# Patient Record
Sex: Female | Born: 1975 | Race: Black or African American | Hispanic: No | Marital: Married | Smoking: Never smoker
Health system: Southern US, Community
[De-identification: ages and names within clinical notes are randomized; demographics above are authoritative.]

## PROBLEM LIST (undated history)

## (undated) DIAGNOSIS — Z8619 Personal history of other infectious and parasitic diseases: Secondary | ICD-10-CM

## (undated) DIAGNOSIS — D649 Anemia, unspecified: Secondary | ICD-10-CM

## (undated) DIAGNOSIS — Z8739 Personal history of other diseases of the musculoskeletal system and connective tissue: Secondary | ICD-10-CM

## (undated) DIAGNOSIS — R87619 Unspecified abnormal cytological findings in specimens from cervix uteri: Secondary | ICD-10-CM

## (undated) DIAGNOSIS — E559 Vitamin D deficiency, unspecified: Secondary | ICD-10-CM

## (undated) DIAGNOSIS — Z87440 Personal history of urinary (tract) infections: Secondary | ICD-10-CM

## (undated) DIAGNOSIS — K219 Gastro-esophageal reflux disease without esophagitis: Secondary | ICD-10-CM

## (undated) DIAGNOSIS — A491 Streptococcal infection, unspecified site: Secondary | ICD-10-CM

## (undated) DIAGNOSIS — R300 Dysuria: Secondary | ICD-10-CM

## (undated) DIAGNOSIS — IMO0002 Reserved for concepts with insufficient information to code with codable children: Secondary | ICD-10-CM

## (undated) DIAGNOSIS — B373 Candidiasis of vulva and vagina: Secondary | ICD-10-CM

## (undated) DIAGNOSIS — A599 Trichomoniasis, unspecified: Secondary | ICD-10-CM

## (undated) DIAGNOSIS — N926 Irregular menstruation, unspecified: Secondary | ICD-10-CM

## (undated) DIAGNOSIS — O209 Hemorrhage in early pregnancy, unspecified: Secondary | ICD-10-CM

## (undated) DIAGNOSIS — Z8719 Personal history of other diseases of the digestive system: Secondary | ICD-10-CM

## (undated) DIAGNOSIS — R102 Pelvic and perineal pain: Secondary | ICD-10-CM

## (undated) DIAGNOSIS — B009 Herpesviral infection, unspecified: Secondary | ICD-10-CM

## (undated) DIAGNOSIS — R7303 Prediabetes: Secondary | ICD-10-CM

## (undated) DIAGNOSIS — K649 Unspecified hemorrhoids: Secondary | ICD-10-CM

## (undated) HISTORY — DX: Streptococcal infection, unspecified site: A49.1

## (undated) HISTORY — DX: Candidiasis of vulva and vagina: B37.3

## (undated) HISTORY — DX: Herpesviral infection, unspecified: B00.9

## (undated) HISTORY — DX: Hemorrhage in early pregnancy, unspecified: O20.9

## (undated) HISTORY — DX: Dysuria: R30.0

## (undated) HISTORY — DX: Personal history of other infectious and parasitic diseases: Z86.19

## (undated) HISTORY — DX: Personal history of other diseases of the musculoskeletal system and connective tissue: Z87.39

## (undated) HISTORY — DX: Unspecified abnormal cytological findings in specimens from cervix uteri: R87.619

## (undated) HISTORY — DX: Trichomoniasis, unspecified: A59.9

## (undated) HISTORY — DX: Prediabetes: R73.03

## (undated) HISTORY — DX: Pelvic and perineal pain: R10.2

## (undated) HISTORY — DX: Vitamin D deficiency, unspecified: E55.9

## (undated) HISTORY — DX: Personal history of urinary (tract) infections: Z87.440

## (undated) HISTORY — PX: WISDOM TOOTH EXTRACTION: SHX21

## (undated) HISTORY — DX: Reserved for concepts with insufficient information to code with codable children: IMO0002

## (undated) HISTORY — DX: Unspecified hemorrhoids: K64.9

## (undated) HISTORY — DX: Anemia, unspecified: D64.9

## (undated) HISTORY — DX: Personal history of other diseases of the digestive system: Z87.19

## (undated) HISTORY — DX: Irregular menstruation, unspecified: N92.6

---

## 1996-04-06 HISTORY — PX: WISDOM TOOTH EXTRACTION: SHX21

## 1997-07-05 DIAGNOSIS — Z87440 Personal history of urinary (tract) infections: Secondary | ICD-10-CM

## 1997-07-05 DIAGNOSIS — R300 Dysuria: Secondary | ICD-10-CM

## 1997-07-05 HISTORY — DX: Dysuria: R30.0

## 1997-07-05 HISTORY — DX: Personal history of urinary (tract) infections: Z87.440

## 2000-04-06 DIAGNOSIS — A491 Streptococcal infection, unspecified site: Secondary | ICD-10-CM

## 2000-04-06 HISTORY — DX: Streptococcal infection, unspecified site: A49.1

## 2000-04-07 DIAGNOSIS — O209 Hemorrhage in early pregnancy, unspecified: Secondary | ICD-10-CM

## 2000-04-07 HISTORY — DX: Hemorrhage in early pregnancy, unspecified: O20.9

## 2000-12-01 ENCOUNTER — Inpatient Hospital Stay (HOSPITAL_COMMUNITY): Admission: AD | Admit: 2000-12-01 | Discharge: 2000-12-01 | Payer: Self-pay | Admitting: Obstetrics and Gynecology

## 2000-12-03 ENCOUNTER — Inpatient Hospital Stay (HOSPITAL_COMMUNITY): Admission: AD | Admit: 2000-12-03 | Discharge: 2000-12-08 | Payer: Self-pay | Admitting: Obstetrics and Gynecology

## 2000-12-03 ENCOUNTER — Encounter (INDEPENDENT_AMBULATORY_CARE_PROVIDER_SITE_OTHER): Payer: Self-pay | Admitting: Specialist

## 2001-01-17 DIAGNOSIS — Z8719 Personal history of other diseases of the digestive system: Secondary | ICD-10-CM

## 2001-01-17 DIAGNOSIS — D649 Anemia, unspecified: Secondary | ICD-10-CM

## 2001-01-17 HISTORY — DX: Personal history of other diseases of the digestive system: Z87.19

## 2001-01-17 HISTORY — DX: Anemia, unspecified: D64.9

## 2001-04-06 DIAGNOSIS — Z8619 Personal history of other infectious and parasitic diseases: Secondary | ICD-10-CM

## 2001-04-06 HISTORY — DX: Personal history of other infectious and parasitic diseases: Z86.19

## 2001-06-03 ENCOUNTER — Other Ambulatory Visit: Admission: RE | Admit: 2001-06-03 | Discharge: 2001-06-03 | Payer: Self-pay | Admitting: Obstetrics and Gynecology

## 2001-06-03 DIAGNOSIS — N926 Irregular menstruation, unspecified: Secondary | ICD-10-CM

## 2001-06-03 HISTORY — DX: Irregular menstruation, unspecified: N92.6

## 2002-05-24 DIAGNOSIS — B373 Candidiasis of vulva and vagina: Secondary | ICD-10-CM

## 2002-05-24 DIAGNOSIS — B3731 Acute candidiasis of vulva and vagina: Secondary | ICD-10-CM

## 2002-05-24 DIAGNOSIS — Z8739 Personal history of other diseases of the musculoskeletal system and connective tissue: Secondary | ICD-10-CM

## 2002-05-24 HISTORY — DX: Candidiasis of vulva and vagina: B37.3

## 2002-05-24 HISTORY — DX: Personal history of other diseases of the musculoskeletal system and connective tissue: Z87.39

## 2002-05-24 HISTORY — DX: Acute candidiasis of vulva and vagina: B37.31

## 2002-06-23 ENCOUNTER — Other Ambulatory Visit: Admission: RE | Admit: 2002-06-23 | Discharge: 2002-06-23 | Payer: Self-pay | Admitting: Obstetrics and Gynecology

## 2003-03-14 DIAGNOSIS — R102 Pelvic and perineal pain: Secondary | ICD-10-CM

## 2003-03-14 HISTORY — DX: Pelvic and perineal pain: R10.2

## 2003-04-03 ENCOUNTER — Ambulatory Visit (HOSPITAL_COMMUNITY): Admission: RE | Admit: 2003-04-03 | Discharge: 2003-04-03 | Payer: Self-pay | Admitting: Obstetrics and Gynecology

## 2003-07-13 ENCOUNTER — Other Ambulatory Visit: Admission: RE | Admit: 2003-07-13 | Discharge: 2003-07-13 | Payer: Self-pay | Admitting: Obstetrics and Gynecology

## 2004-09-12 ENCOUNTER — Other Ambulatory Visit: Admission: RE | Admit: 2004-09-12 | Discharge: 2004-09-12 | Payer: Self-pay | Admitting: Obstetrics and Gynecology

## 2004-09-12 ENCOUNTER — Ambulatory Visit (HOSPITAL_COMMUNITY): Admission: RE | Admit: 2004-09-12 | Discharge: 2004-09-12 | Payer: Self-pay | Admitting: Obstetrics and Gynecology

## 2005-01-11 ENCOUNTER — Inpatient Hospital Stay (HOSPITAL_COMMUNITY): Admission: AD | Admit: 2005-01-11 | Discharge: 2005-01-11 | Payer: Self-pay | Admitting: Obstetrics and Gynecology

## 2005-01-14 ENCOUNTER — Inpatient Hospital Stay (HOSPITAL_COMMUNITY): Admission: RE | Admit: 2005-01-14 | Discharge: 2005-01-17 | Payer: Self-pay | Admitting: Obstetrics and Gynecology

## 2009-08-02 ENCOUNTER — Ambulatory Visit (HOSPITAL_COMMUNITY): Admission: RE | Admit: 2009-08-02 | Discharge: 2009-08-02 | Payer: Self-pay | Admitting: Obstetrics and Gynecology

## 2009-11-01 ENCOUNTER — Inpatient Hospital Stay (HOSPITAL_COMMUNITY): Admission: AD | Admit: 2009-11-01 | Discharge: 2009-11-04 | Payer: Self-pay | Admitting: Obstetrics and Gynecology

## 2010-06-21 LAB — CBC
HCT: 22.1 % — ABNORMAL LOW (ref 36.0–46.0)
HCT: 24 % — ABNORMAL LOW (ref 36.0–46.0)
HCT: 30.7 % — ABNORMAL LOW (ref 36.0–46.0)
HCT: 38.3 % (ref 36.0–46.0)
Hemoglobin: 10.4 g/dL — ABNORMAL LOW (ref 12.0–15.0)
Hemoglobin: 12.8 g/dL (ref 12.0–15.0)
Hemoglobin: 7.5 g/dL — ABNORMAL LOW (ref 12.0–15.0)
Hemoglobin: 8 g/dL — ABNORMAL LOW (ref 12.0–15.0)
MCH: 29.2 pg (ref 26.0–34.0)
MCH: 29.6 pg (ref 26.0–34.0)
MCH: 30 pg (ref 26.0–34.0)
MCH: 30.6 pg (ref 26.0–34.0)
MCHC: 33.3 g/dL (ref 30.0–36.0)
MCHC: 33.4 g/dL (ref 30.0–36.0)
MCHC: 34 g/dL (ref 30.0–36.0)
MCHC: 34.2 g/dL (ref 30.0–36.0)
MCV: 87.6 fL (ref 78.0–100.0)
MCV: 88.3 fL (ref 78.0–100.0)
MCV: 88.6 fL (ref 78.0–100.0)
MCV: 89.6 fL (ref 78.0–100.0)
Platelets: 122 10*3/uL — ABNORMAL LOW (ref 150–400)
Platelets: 133 10*3/uL — ABNORMAL LOW (ref 150–400)
Platelets: 155 10*3/uL (ref 150–400)
Platelets: 157 10*3/uL (ref 150–400)
RBC: 2.46 MIL/uL — ABNORMAL LOW (ref 3.87–5.11)
RBC: 2.71 MIL/uL — ABNORMAL LOW (ref 3.87–5.11)
RBC: 3.48 MIL/uL — ABNORMAL LOW (ref 3.87–5.11)
RBC: 4.37 MIL/uL (ref 3.87–5.11)
RDW: 15.8 % — ABNORMAL HIGH (ref 11.5–15.5)
RDW: 15.9 % — ABNORMAL HIGH (ref 11.5–15.5)
RDW: 16 % — ABNORMAL HIGH (ref 11.5–15.5)
RDW: 16.1 % — ABNORMAL HIGH (ref 11.5–15.5)
WBC: 10.5 10*3/uL (ref 4.0–10.5)
WBC: 15.1 10*3/uL — ABNORMAL HIGH (ref 4.0–10.5)
WBC: 15.6 10*3/uL — ABNORMAL HIGH (ref 4.0–10.5)
WBC: 17 10*3/uL — ABNORMAL HIGH (ref 4.0–10.5)

## 2010-06-21 LAB — SURGICAL PCR SCREEN
MRSA, PCR: NEGATIVE
Staphylococcus aureus: NEGATIVE

## 2010-06-21 LAB — URINALYSIS, ROUTINE W REFLEX MICROSCOPIC
Glucose, UA: NEGATIVE mg/dL
Hgb urine dipstick: NEGATIVE
Ketones, ur: 15 mg/dL — AB
Nitrite: NEGATIVE
Protein, ur: NEGATIVE mg/dL
Specific Gravity, Urine: >1.03 — ABNORMAL HIGH (ref 1.005–1.030)
Urobilinogen, UA: 1 mg/dL (ref 0.0–1.0)
pH: 6 (ref 5.0–8.0)

## 2010-06-21 LAB — RPR: RPR Ser Ql: NONREACTIVE

## 2010-08-22 NOTE — H&P (Signed)
[facility]  Patient:    Lindsey Harrington, Lindsey Harrington Visit Number: [id number] MRN: [id number]          Service Type: OBS Location: [facility] Attending Physician:  Leonard Schwartz Dictated by:   Saverio Danker, C.N.M. Admit Date:  12/01/2000 Discharge Date: 12/01/2000                           History and Physical  HISTORY:                      Ms. Bruss is a 35 year old married black female gravida 1, para 0 at 58 2/7 weeks who presents for induction of labor secondary to being post dates.  The risks of induction including failure to progress, fetal distress, and increased risk of cesarean section have all been discussed with patient and she and her husband desire to proceed.  She denies any nausea, vomiting, headaches, or visual disturbances.  She was seen in maternity admissions on Wednesday secondary to having 1+ proteinuria in the office visit and feeling decreased fetal movement and had a reactive NST and negative protein on a catheterized urine specimen at that time.  She also had negative PIH laboratories at that visit.  She was subsequently scheduled for induction of labor because she was post dates.  Her pregnancy has been followed at [facility] by the certified nurse midwife service and has been essentially uncomplicated, though at risk for positive group B Strep, first trimester spotting, irregular cycles, obesity.  PAST OBSTETRICAL HISTORY:     She is a primigravida with an LMP of February 06, 2000 giving her an [facility] of November 12, 2000.  She also had a history of irregular cycles so the more certain EDC was November 24, 2000 established by early ultrasound.  ALLERGIES:                    TYLOX.  She says it makes her shaky and dizzy. She has difficulty swallowing pills.  PAST MEDICAL HISTORY:         She reports having had the usual childhood diseases.  She reports a history of anemia in the past, a history of occasional urinary  tract infections.  FAMILY HISTORY:               Significant for maternal grandmother with heart attack and hypertension, paternal aunt with arthritis.  GENETIC HISTORY:              Significant only for a cousin with some kind of heart problems, a maternal first cousin with Down syndrome, and the father of the babys first cousin with some kind of mental retardation.  SOCIAL HISTORY:               She is married to Eual Fines who is involved and supportive.  They are both employed full-time.  They are of the [employer] faith.  They deny any illicit drug use, alcohol, or smoking with this pregnancy.  PRENATAL LABORATORIES:        Blood type O+.  Antibody screen is negative. Sickle cell trait is negative.  Syphilis is nonreactive.  Rubella is immune. Hepatitis B surface antigen is negative.  HIV is nonreactive.  GC and chlamydia are both negative.  Her one hour glucola was elevated.  Her three hour GTT was within normal range.  She declined a maternal serum alpha fetoprotein.  Her 36 week  beta Strep was positive.  PHYSICAL EXAMINATION  VITAL SIGNS:                  Stable.  She is afebrile.  HEENT:                        Grossly within normal limits.  HEART:                        Regular rate and rhythm.  CHEST:                        Clear.  BREASTS:                      Soft and nontender.  ABDOMEN:                      Gravid with uterine contractions that are irregular and mild.  Fetal heart rate is reactive and reassuring.  PELVIC:                       Cervix is 1 cm, external os closed, internal os 50%, vertex, -2.  EXTREMITIES:                  Within normal limits.  ASSESSMENT:                   1. Intrauterine pregnancy at 41 2/7 weeks.                               2. Unfavorable cervix.                               3. Positive group B Strep.  PLAN:                         Admit to labor and delivery for a consult with Dr. Leonard Schwartz for induction  of labor.  Start the induction process with Cervidil for cervical ripening.  To otherwise follow routine C.N.M. orders and give her penicillin for group B Strep prophylaxis when in labor. Dictated by:   Vance Gather Duplantis, C.N.M. Attending Physician:  Leonard Schwartz DD:  12/03/00 TD:  12/03/00 Job: [id number]

## 2010-08-22 NOTE — Discharge Summary (Signed)
[facility]  Patient:    Lindsey Harrington, Lindsey Harrington Visit Number: [id number] MRN: [id number]          Service Type: OBS Location: 910A 9132 01 Attending Physician:  Leonard Schwartz Dictated by:   Wynelle Bourgeois, CNM Admit Date:  12/03/2000 Discharge Date: 12/08/2000                             Discharge Summary  ADMISSION DIAGNOSES:          1. Intrauterine pregnancy at 41-2/7 weeks.                               2. Unfavorable cervix.                               3. Group B strep positive.  DISCHARGE DIAGNOSES:          1. Intrauterine pregnancy at 41-2/7 weeks.                               2. Unfavorable cervix.                               3. Group B strep positive.                               4. Status post primary low transverse                                  cesarean section.                               5. Arrested active phase of labor.  HOSPITAL PROCEDURES:          1. Primary low transverse cesarean section for a                                  viable female infant named Jalyn, 8 pounds                                  0 ounces, Apgars 6,7, and 9.                               2. Epidural anesthesia.  HOSPITAL COURSE:              The patient was admitted for induction of labor secondary to postdates. Cervical ripening was begun on December 04, 2000 with good progress and Pitocin was started later that afternoon with slow progression of cervical dilation. The patient progressed to 9 cm on December 05, 2000 during the middle of the night and progressed slowed at that point, and the patient began to develop nonreassuring fetal heart rate tracing with tachycardia and prolonged decelerations and her cervix remained unchanged at 9 cm, -1 station, with a swelling cervix. A decision was  made at that time to proceed with primary low transverse cesarean section with Dr. Stefano Gaul and Mack Guise, C.N.M. as first assistant for arrested  active phase of labor and nonreassuring fetal heart tones.  The patient recovered normally and on postoperative day #1 was doing well with normal physical examination and clean, dry incision. The baby was taken to the NICU for tachypnea and antibiotic treatment for seven days. On postoperative day #2 the patient was doing well, although appropriately concerned about her baby. On postoperative day #3 her vital signs are stable. She is afebrile. Chest was clear to auscultation bilaterally. Heart rate with regular rate and rhythm and abdomen was soft and appropriately tender. Incision was clean, dry, and intact with staples. Lochia was small to moderate. Extremities were within normal limits and the patient was seen by Dr. Stefano Gaul and deemed to have received the full benefit of her hospital stay and was discharged home after staple removal.  DISCHARGE LABORATORIES:       WBCs 16.3, hemoglobin 9.6, hematocrit 28.2, platelets 151,000.  DISCHARGE MEDICATIONS:        Motrin, Darvocet, and Micronor.  DISCHARGE INSTRUCTIONS:       Instructions per [facility] handout.  DISCHARGE FOLLOWUP:           The patient is to follow up in six weeks at [facility] or p.r.n. as indicated. Dictated by:   Wynelle Bourgeois, CNM Attending Physician:  Leonard Schwartz DD:  12/08/00 TD:  12/08/00 Job: [id number]

## 2010-08-22 NOTE — Op Note (Signed)
[facility]  Patient:    Lindsey Harrington, Lindsey Harrington Visit Number: [id number] MRN: [id number]          Service Type: OBS Location: 910A 9132 01 Attending Physician:  Leonard Schwartz Dictated by:   Janine Limbo, M.D. Proc. Date: 12/05/00 Admit Date:  12/03/2000                             Operative Report  PREOPERATIVE DIAGNOSES:       1. A 41-1/2-week gestation.                               2. Arrested active phase of labor.  POSTOPERATIVE DIAGNOSES:      1. A 41-1/2-week gestation.                               2. Arrested active phase of labor.  PROCEDURE:                    Primary low transverse cesarean section.  SURGEON:                      Janine Limbo, M.D.  ASSISTANT:                    Mack Guise, C.N.M.  ANESTHESIA:                   Epidural.  DISPOSITION:                  [name] is a 35 year old female, gravida 1, para 0, who presented at 41-2/[redacted] weeks gestation on December 03, 2000 for induction of labor. Her induction did not produce labor on December 04, 2000 and therefore, she was continued on augmentation. She did begin to labor finally and she dilated her cervix to 9 cm. At that point, however, she began to develop swelling of her cervix. The fetal head was still noted to be at -1 to 0 station. The patient then began having decelerations associated with continued Pitocin augmentation. The decision was made to proceed with cesarean delivery. The patient understood the indications for her surgical procedure and she accepted the risk of, but not limited to, anesthetic complications, bleeding, infections, and possible damage to the surrounding organs.  FINDINGS:                     A 3627 gram female Haynes Bast) was delivered from an occiput anterior presentation. The Apgars were 6 at one minute, 7 at five minutes, and 9 at ten minutes. The infant did cry but there was some question about compromise of the infants  breathing. Vernix was noted in the lung spaces after intubation. The patient had normal fallopian tubes, ovaries, and a normal appearing uterus otherwise.  DESCRIPTION OF PROCEDURE:     The patient was taken to the operating room where it was determined that the epidural she had received for labor would be adequate for cesarean section. The abdomen was prepped with multiple layers of Betadine. A sterile drape was then applied. A low transverse incision was made in the abdomen and carried sharply through the subcutaneous tissue, the fascia, and the anterior peritoneum. An incision was made in the lower uterine segment and extended transversely. The  fetal head was delivered without difficulty. The mouth and nose were suctioned. The infant was then completely delivered and handed to the awaiting pediatric team. Routine cord blood studies were obtained. The placenta was removed. The uterine cavity was cleaned of amniotic fluid, clotted blood, and membranes. The incision was closed using a running locking suture of 2-0 Vicryl. Hemostasis was adequate. The pericolonic gutters were cleaned of amniotic fluid and clotted blood. The anterior peritoneum was closed using a figure-of-eight suture of 2-0 Vicryl as was the abdominal musculature. All layers were vigorously irrigated. The fascia was then closed using a running suture of 0 Vicryl followed by three interrupted sutures of 0 Vicryl. The subcutaneous layer was closed using a running suture of 2-0 Vicryl. The skin was reapproximated using skin staples. Sponge, needle, and instrument counts were correct on two occasions. The estimated blood loss was 700 cc. The patient tolerated her procedure well. The patient was then taken to the recovery room in stable condition. The infant was taken to the neonatal intensive care unit for evaluation of her respiratory status. Dictated by:   Janine Limbo, M.D. Attending Physician:  Leonard Schwartz DD:  12/05/00 TD:  12/05/00 Job: [phone] [id number]

## 2010-08-22 NOTE — Op Note (Signed)
Lindsey Harrington, Lindsey Harrington               ACCOUNT NO.:  [id number]   MEDICAL RECORD NO.:  [id number]          PATIENT TYPE:  INP   LOCATION:  9199                          FACILITY:  [facility]   PHYSICIAN:  Janine Limbo, M.D.DATE OF BIRTH:  07/25/1975   DATE OF PROCEDURE:  01/14/2005  DATE OF DISCHARGE:                                 OPERATIVE REPORT   PREOPERATIVE DIAGNOSIS:  1.  Term intrauterine pregnancy.  2.  Prior cesarean section.  3.  Breech presentation.   POSTOPERATIVE DIAGNOSIS:  1.  Term intrauterine pregnancy.  2.  Prior cesarean section.  3.  Breech presentation.   PROCEDURE:  Repeat low transverse cesarean section.   SURGEON:  Dr. Lafayette Dragon stringer   FIRST ASSISTANT:  Rica Koyanagi, C.N.M.   ANESTHETIC:  Spinal.   DISPOSITION:  Lindsey Harrington is a 35 year old female, gravida 2, para 1-0-0-1,  who presents for a repeat cesarean section. The patient understands the  indications for her surgical procedure and she accepts the risks of, but not  limited to, anesthetic complications, bleeding, infection, and possible  damage to surrounding organs.   FINDINGS:  A 7 pounds 3 ounces female infant ([name]) was delivered from a  complete breech presentation. The Apgars were 8 at one minute and 9 at 5  minutes. The uterus, fallopian tubes, and the ovaries were normal for the  gravid state.   PROCEDURE:  The patient was taken to the operating room where spinal  anesthetic was given. The patient's abdomen, perineum, and outer vagina were  prepped with multiple layers of Betadine. A Foley catheter was placed in the  bladder. The patient was then sterilely draped. The lower abdomen was  injected with 10 mL of half percent Marcaine with epinephrine. A low  transverse incision was made and carried sharply through the subcutaneous  tissue, fascia, and the anterior peritoneum. An incision was made in the  lower uterine segment and extended transversely. The infant was  delivered  from a breech extraction without difficulty. The mouth and nose were  suctioned. The cord was clamped and cut and the infant was handed to the  awaiting pediatric team. Routine cord blood studies were obtained. The  placenta was removed. The uterine cavity was cleaned of amniotic fluid and  clotted blood. The uterine incision was closed using a running locking  suture of 2-0 Vicryl followed by an imbricating suture of 2-0 Vicryl. The  pelvis was irrigated. The anterior peritoneum and abdominal musculature were  reapproximated in the midline using 2-0 Vicryl. The fascia was closed using  a running suture of 0 Vicryl followed by three interrupted sutures of 0  Vicryl. The subcutaneous layer was irrigated. The subcutaneous layer was  closed using 0 Vicryl. The skin was reapproximated using a subcuticular  suture of 3-0 Monocryl. Sponge, needle,  instrument counts were correct on two occasions. Estimated blood loss for  the procedure was 1000 mL. The patient tolerated her procedure well. She was  returned to the recovery room in stable condition. The infant was taken to  the full-term nursery in stable condition.  Janine Limbo, M.D.  Electronically Signed     AVS/MEDQ  D:  01/14/2005  T:  01/14/2005  Job:  [id number]

## 2010-08-22 NOTE — Discharge Summary (Signed)
Lindsey Harrington, Lindsey Harrington               ACCOUNT NO.:  [id number]   MEDICAL RECORD NO.:  [id number]          PATIENT TYPE:  INP   LOCATION:  9127                          FACILITY:  [facility]   PHYSICIAN:  Janine Limbo, M.D.DATE OF BIRTH:  02-Jun-1975   DATE OF ADMISSION:  01/14/2005  DATE OF DISCHARGE:  01/17/2005                                 DISCHARGE SUMMARY   ADMISSION DIAGNOSES:  1.  Intrauterine pregnancy at term.  2.  Previous Cesarean section.  3.  Desires repeat Cesarean section.   DISCHARGE DIAGNOSES:  1.  Intrauterine pregnancy at term.  2.  Previous Cesarean section.  3.  Desires repeat Cesarean section.  4.  Status post Cesarean section of a female infant named Lindsey Harrington weighing 7      pounds 3 ounces, Apgars 8 and 9.   HOSPITAL PROCEDURES:  1.  Spinal anesthesia.  2.  Repeat low transverse Cesarean section.   HOSPITAL COURSE:  The patient was admitted for an elective repeat Cesarean  section which was performed by Dr. Stefano Gaul under spinal anesthesia with no  complications. She was delivered of a female infant named Lindsey Harrington, weighing 7  pounds 3 ounces, Apgars 8 and 9. Estimated blood loss was 1,000 cc. She was  taken to recovery and then to mother/baby unit where she did well. She had  some difficulty with pain management over the first two days. Darvocet was  not working, and Dilaudid was tried. She claimed that Dilaudid was not  working, but she did sleep with the Dilaudid. On postoperative day #3, pain  was better controlled.   PHYSICAL EXAMINATION:  VITAL SIGNS:  Vital signs were stable. She was  afebrile.  CHEST:  Clear.  HEART:  Regular rate and rhythm.  ABDOMEN:  Soft and appropriately tender. Incision was clean, dry, and  intact. Lochia was normal.  EXTREMITIES:  Within normal limits.   Since she was deemed to receive the full benefit of her hospital stay, she  was discharged home.   DISCHARGE MEDICATIONS:  1.  Motrin 600 mg p.o. q.6h. p.r.n.  2.   Micronor 1 p.o. daily.  3.  Dilaudid 2 mg 1 p.o. q.4h. p.r.n. severe pain.  4.  Darvocet N-100 one to two p.o. q.4h. p.r.n. when not using Dilaudid.   DISCHARGE LABORATORY DATA:  White blood cell count 16.3, hemoglobin 9.3,  platelet count 165.   DISCHARGE INSTRUCTIONS:  Per handout. Discharge followup in 6 weeks or  p.r.n.      Marie L. Williams, C.N.M.      Janine Limbo, M.D.  Electronically Signed    MLW/MEDQ  D:  01/17/2005  T:  01/17/2005  Job:  [id number]

## 2010-08-22 NOTE — H&P (Signed)
NAMEJAQUELINNE, GLENDENING               ACCOUNT NO.:  [id number]   MEDICAL RECORD NO.:  [id number]           PATIENT TYPE:   LOCATION:                                 FACILITY:   PHYSICIAN:  Janine Limbo, M.D.DATE OF BIRTH:  [birth year]   DATE OF ADMISSION:  DATE OF DISCHARGE:                                HISTORY & PHYSICAL   Ms. Schlegel is a 35 year old gravida 2, para 1-0-0-1 who presents at term for  repeat cesarean delivery. Her pregnancy has been followed by the M.D.  service at [facility] and is remarkable for:  1.  Previous C-section, desires repeat.  2.  HSV-II.  3.  Obesity.  4.  Group B strep positive.   This patient entered prenatal care on June 26, 2004 at approximately [redacted]  weeks gestation as determined by dates and confirmed with this early first  trimester ultrasound. Her pregnancy has been unremarkable. She has been size  equal to dates throughout, normotensive with no proteinuria.   Prenatal lab work on June 26, 2004:  Hemoglobin and hematocrit 12.9 and  39.6, platelets 284,000. Blood type and Rh O+, antibody screen negative,  sickle cell trait negative, VDRL nonreactive, rubella immune, hepatitis B  surface antigen negative, HIV nonreactive. The patient declined quad screen.  An 18-week ultrasound showed normal anatomy, growth and fluid. At 28 weeks,  1-hour glucose challenge was elevated; 3-hour GTT within normal limits. At  36 weeks, culture of the vaginal tract is positive for group B strep.   OBSTETRICAL HISTORY:  In 2002, the patient had a primary low transverse  cesarean section with the birth of a 7-pound 15-ounce female, infant named  [place], and the present pregnancy.   MEDICAL HISTORY:  Is unremarkable.   SURGICAL HISTORY:  C section in 2002, wisdom teeth in 1998.   FAMILY HISTORY:  Maternal grandmother and the patient's brother with a  history of chronic hypertension. Maternal grandfather cancer. Paternal uncle  cancer of unknown type. Maternal  grandmother stroke. Genetic history is  unremarkable   SOCIAL HISTORY:  Ms. Orea is a married African-American female. Her  husband, Tomasita Beevers, is involved and supportive. They are [employer] in their  faith. The patient has an allergy to Tylox and denies the use of tobacco,  alcohol or illicit drugs.   REVIEW OF SYSTEMS:  As described above. The patient is typical of one with a  uterine pregnancy at term. She present presents for repeat cesarean  delivery.   PHYSICAL EXAMINATION:  VITAL SIGNS:  Vital signs are stable. The patient is  afebrile.  HEENT:  Unremarkable.  HEART:  Regular rate and rhythm.  LUNGS:  Clear.  ABDOMEN:  Gravid in its contour. Uterine fundus noted to extend 40 cm above  the level of the pubic symphysis. Leopold's maneuvers find the infant to be  in a longitudinal lie, cephalic presentation and the estimated fetal weight  of 7-1/2 to 8 pounds. Fetal heart tones are 150s.  EXTREMITIES:  Extremities show no pathologic edema. DTRs are 1+ with no  clonus. No calf tenderness is noted bilaterally.  ASSESSMENT:  Intrauterine pregnancy at term, repeat cesarean delivery.   PLAN:  Admit per Dr. Marline Backbone with routine preop orders as written.      Rica Koyanagi, C.N.M.      Janine Limbo, M.D.  Electronically Signed    SDM/MEDQ  D:  01/14/2005  T:  01/14/2005  Job:  [id number]

## 2011-07-22 ENCOUNTER — Telehealth: Payer: Self-pay | Admitting: Obstetrics and Gynecology

## 2011-07-22 NOTE — Telephone Encounter (Signed)
rx fefill not approved due to pt got rx called into pharmacy on 07/2011 with 1 refill. Pt has aex 08/05/11 with avs . alyacen 1-35-28 tab sig 1 po qd . bt cma

## 2011-08-05 ENCOUNTER — Ambulatory Visit: Payer: Self-pay | Admitting: Obstetrics and Gynecology

## 2011-08-26 ENCOUNTER — Encounter: Payer: Self-pay | Admitting: Obstetrics and Gynecology

## 2011-08-26 ENCOUNTER — Ambulatory Visit (INDEPENDENT_AMBULATORY_CARE_PROVIDER_SITE_OTHER): Payer: 59 | Admitting: Obstetrics and Gynecology

## 2011-08-26 VITALS — BP 114/70 | HR 74 | Ht 66.0 in | Wt 197.0 lb

## 2011-08-26 DIAGNOSIS — D219 Benign neoplasm of connective and other soft tissue, unspecified: Secondary | ICD-10-CM | POA: Insufficient documentation

## 2011-08-26 DIAGNOSIS — Z124 Encounter for screening for malignant neoplasm of cervix: Secondary | ICD-10-CM

## 2011-08-26 DIAGNOSIS — R102 Pelvic and perineal pain: Secondary | ICD-10-CM

## 2011-08-26 DIAGNOSIS — N920 Excessive and frequent menstruation with regular cycle: Secondary | ICD-10-CM

## 2011-08-26 DIAGNOSIS — D259 Leiomyoma of uterus, unspecified: Secondary | ICD-10-CM

## 2011-08-26 DIAGNOSIS — D649 Anemia, unspecified: Secondary | ICD-10-CM

## 2011-08-26 DIAGNOSIS — B009 Herpesviral infection, unspecified: Secondary | ICD-10-CM

## 2011-08-26 DIAGNOSIS — Z01419 Encounter for gynecological examination (general) (routine) without abnormal findings: Secondary | ICD-10-CM

## 2011-08-26 DIAGNOSIS — K649 Unspecified hemorrhoids: Secondary | ICD-10-CM

## 2011-08-26 DIAGNOSIS — N949 Unspecified condition associated with female genital organs and menstrual cycle: Secondary | ICD-10-CM

## 2011-08-26 MED ORDER — RABEPRAZOLE SODIUM 20 MG PO TBEC: 20.0000 mg | DELAYED_RELEASE_TABLET | Freq: Every day | ORAL | Status: AC

## 2011-08-26 MED ORDER — NORGESTIM-ETH ESTRAD TRIPHASIC 0.18/0.215/0.25 MG-35 MCG PO TABS: 1.0000 | ORAL_TABLET | Freq: Every day | ORAL | Status: DC

## 2011-08-26 MED ORDER — VALACYCLOVIR HCL 500 MG PO TABS: 500.0000 mg | ORAL_TABLET | Freq: Two times a day (BID) | ORAL | Status: DC

## 2011-08-26 NOTE — Progress Notes (Signed)
Subjective:    Lindsey Harrington is a 36 y.o. female, G3P3003, who presents for an annual exam. The patient wants tubal sterilization. Reviewed her use of BCPs for cycle control and discussed ablation, Mirena IUD and myomectomy as possible means of addressing the heavy flow she reports off of BCPs.  Wants to resume Tri Sprintec as her current BCPs cause acne.  Menstrual cycle:   LMP: Patient's last menstrual period was 08/17/2011.           Cycle is monthly with normal flow                                  and without intermenstrual bleeding or severe dysmenorrhea  Review of Systems Pertinent items are noted in HPI. Denies pelvic pain, uti symptoms, vaginitis symptoms, irregular bleeding, menopausal symptoms, change in bowel habits or rectal bleeding   Objective:    BP 114/70  Pulse 74  Ht 5\' 6"  (1.676 m)  Wt 197 lb (89.359 kg)  BMI 31.80 kg/m2  LMP 08/17/2011  :  Wt Readings from Last 1 Encounters:  08/26/11 197 lb (89.359 kg)    Body mass index is 31.80 kg/(m^2). General Appearance: Alert, appropriate appearance for age. No acute distress HEENT: Grossly normal Neck / Thyroid: Supple, no thyromegaly or cervical adenopathy Lungs: clear to auscultation bilaterally Back: No CVA tenderness Breast Exam: No masses or nodes.No dimpling, nipple retraction or discharge. Cardiovascular: Regular rate and rhythm.  Gastrointestinal: Soft, non-tender, no masses or organomegaly Pelvic Exam: EGBUS-wnl, vagina-normal rugae, cervix- without lesions or tenderness, uterus appears normal size shape and consistency though exam limited by habitus,  adnexae-no masses or tenderness Lymphatic Exam: Non-palpable nodes in neck, clavicular,  axillary, or inguinal regions  Skin: no rashes or abnormalities Extremities: no clubbing cyanosis or edema  Neurologic: grossly normal Psychiatric: Alert and oriented, appropriate affect.   Assessment:   Routine GYN Exam Desire for  Sterilzation Fibroids Menorrhagia-controlled with BCPs Plan:   Refill: Tri-Sprintec #1 1po qd 11 rf; Aciphex 20 mg #30 1 po             daily 6 rf;  Valtrex 500 mg #30 1 po bid x 5 days prn             11 rf    U/S to evaluate fibroids (never had an ultrasound without being pregnant) in order to consider all options for menorrhagia management   Reviewed Mirena, ablation, tubal sterilization procedures along with risks, benefits and limitations-brochures to be given at check out PAP sent   RTO after ultrasound-to see Dr. Malva Limes

## 2011-08-26 NOTE — Progress Notes (Signed)
Regular Periods: yes Mammogram: no  Monthly Breast Ex.: yes Exercise: yes  Tetanus < 10 years: yes Seatbelts: yes  NI. Bladder Functn.: yes Abuse at home: no  Daily BM's: yes Stressful Work: no  Healthy Diet: no Sigmoid-Colonoscopy: NO  Calcium: no Medical problems this year: ? ABOUT BTL   LAST PAP:2012 NL  Contraception: ALYACEN ( GEN FOR ON 1/35)  Mammogram:  NO  PCP: NO  PMH: NO CHANGE  FMH: NO CHANGE  Last Bone Scan: NO

## 2011-09-01 LAB — PAP IG W/ RFLX HPV ASCU

## 2011-09-30 ENCOUNTER — Ambulatory Visit (INDEPENDENT_AMBULATORY_CARE_PROVIDER_SITE_OTHER): Payer: 59 | Admitting: Obstetrics and Gynecology

## 2011-09-30 ENCOUNTER — Other Ambulatory Visit: Payer: Self-pay | Admitting: Obstetrics and Gynecology

## 2011-09-30 ENCOUNTER — Encounter: Payer: Self-pay | Admitting: Obstetrics and Gynecology

## 2011-09-30 ENCOUNTER — Ambulatory Visit (INDEPENDENT_AMBULATORY_CARE_PROVIDER_SITE_OTHER): Payer: 59

## 2011-09-30 VITALS — BP 110/70 | Resp 16 | Wt 201.0 lb

## 2011-09-30 DIAGNOSIS — N920 Excessive and frequent menstruation with regular cycle: Secondary | ICD-10-CM

## 2011-09-30 DIAGNOSIS — D259 Leiomyoma of uterus, unspecified: Secondary | ICD-10-CM

## 2011-09-30 DIAGNOSIS — D219 Benign neoplasm of connective and other soft tissue, unspecified: Secondary | ICD-10-CM

## 2011-09-30 DIAGNOSIS — Z309 Encounter for contraceptive management, unspecified: Secondary | ICD-10-CM

## 2011-09-30 NOTE — Progress Notes (Signed)
HISTORY OF PRESENT ILLNESS  Lindsey Harrington is a 36 y.o. year old female,[id number], who presents for a problem visit. The patient is scheduled for an ultrasound to evaluate fibroids.  She is interested in contraception.  Subjective:  The patient does not want Depo-Provera.  Her husband has declined vasectomy.  Objective:  BP 110/70  Resp 16  Wt 201 lb (91.173 kg)  LMP 09/19/2011   General: alert, cooperative and no distress  Exam deferred.  Ultrasound: Uterus measures 9.56 x 5.72 cm.  Endometrium measured 0.711 cm.  A 2.6 cm intramural fibroid was noted on the left with calcifications.  Assessment:  Fibroid uterus Contraceptive management  Plan:  The medical and surgical management of fibroids were reviewed.  The patient is largely asymptomatic.  I do not feel that any therapy is required at the moment.  Contraceptive options were outlined.  The risk and benefits of each were discussed.  Questions were answered.  The patient seems most interested in [place] or ParaGard.  Handouts were given.  The patient will call when she wants to schedule.  Return to office prn if symptoms worsen or fail to improve.   Leonard Schwartz M.D.  09/30/2011 12:49 PM

## 2011-11-06 ENCOUNTER — Encounter: Payer: 59 | Admitting: Obstetrics and Gynecology

## 2011-11-12 ENCOUNTER — Encounter: Payer: 59 | Admitting: Obstetrics and Gynecology

## 2011-11-12 ENCOUNTER — Ambulatory Visit (INDEPENDENT_AMBULATORY_CARE_PROVIDER_SITE_OTHER): Payer: 59 | Admitting: Obstetrics and Gynecology

## 2011-11-12 ENCOUNTER — Encounter: Payer: Self-pay | Admitting: Obstetrics and Gynecology

## 2011-11-12 VITALS — BP 114/70 | HR 74 | Wt 210.0 lb

## 2011-11-12 DIAGNOSIS — Z30430 Encounter for insertion of intrauterine contraceptive device: Secondary | ICD-10-CM

## 2011-11-12 DIAGNOSIS — IMO0001 Reserved for inherently not codable concepts without codable children: Secondary | ICD-10-CM

## 2011-11-12 LAB — POCT URINE PREGNANCY: Preg Test, Ur: NEGATIVE

## 2011-11-12 MED ORDER — LEVONORGESTREL 20 MCG/24HR IU IUD
INTRAUTERINE_SYSTEM | Freq: Once | INTRAUTERINE | Status: AC
  Administered 2011-11-12: 1 via INTRAUTERINE

## 2011-11-12 NOTE — Addendum Note (Signed)
Addended byWinfred Leeds on: 11/12/2011 12:34 PM   Modules accepted: Orders

## 2011-11-12 NOTE — Patient Instructions (Addendum)
Keep follow up appointment in 4 weeks  Call [facility] [phone]:  -for temperature of 100.4 degrees Fahrenheit or more -pain not improved with over the counter pain medications (Ibuprofen, Advil, Aleve,        Tylenol or acetaminophen) -for excessive bleeding (more than a usual period) -for any other concerns  Do not place anything in your vagina for the next 7 days   

## 2011-11-12 NOTE — Progress Notes (Signed)
IUD INSERTION NOTE  Lindsey Harrington is a 36 y.o. female [phone] who presents for IUD insertion.  Currently on BCPs.  Consent signed after risks and benefits were reviewed including but not limited to bleeding, infection, expulsion and risk of uterine perforation that may require an additional procedure for removal.  LMP: Patient's last menstrual period was 10/15/2011. UPT: negative  MIRENA LOT NUMBER: [id number]  Uterus assessed for size and position Prepped with BetadinE Tenaculum placed on anterior lip of cervix after Hurricane gel was applied Uterus sounded at  8 cm Insertion of MIRENA IUD per protocol without any complications Strings trimmed   Assessment:  IUD Insertion  Plan:  1. Patient instructed to call with oral temperature of 100.4 degrees Fahrenheit or more, excessive bleeding or pain that is not relieved with OTC analgesia taken as directed  2. Patient instructed on how  to check IUD strings and encouraged to do so after each menstrual cycle  3. Advised not to place anything in vagina or have sexual intercourse for 7 days  4. Follow-up: 4 weeks   Doyne Micke PA-C 11/12/2011 11:56 AM

## 2011-12-09 ENCOUNTER — Encounter: Payer: Self-pay | Admitting: Obstetrics and Gynecology

## 2011-12-09 ENCOUNTER — Ambulatory Visit (INDEPENDENT_AMBULATORY_CARE_PROVIDER_SITE_OTHER): Payer: 59 | Admitting: Obstetrics and Gynecology

## 2011-12-09 VITALS — BP 116/72 | HR 80 | Wt 209.0 lb

## 2011-12-09 DIAGNOSIS — Z30431 Encounter for routine checking of intrauterine contraceptive device: Secondary | ICD-10-CM

## 2011-12-09 LAB — POCT URINE PREGNANCY: Preg Test, Ur: NEGATIVE

## 2011-12-09 NOTE — Progress Notes (Signed)
36 YO with Mirena inserted 11/12/11 denies any pain or continued bleeding after the first week,  O: Abdomen: soft, non-tender      Pelvic:  EGBUS-wnl, vagina-normal, cervix-strings visible, uterus-non-tender, adnexae-no tenderness  A:  IUD Check  P:  RTO- May 2014 AEx or prn  Mackensi Mahadeo, PA-C

## 2011-12-10 ENCOUNTER — Encounter: Payer: 59 | Admitting: Obstetrics and Gynecology

## 2014-02-05 ENCOUNTER — Encounter: Payer: Self-pay | Admitting: Obstetrics and Gynecology

## 2014-12-14 ENCOUNTER — Other Ambulatory Visit: Payer: Self-pay | Admitting: Obstetrics and Gynecology

## 2014-12-14 DIAGNOSIS — N6310 Unspecified lump in the right breast, unspecified quadrant: Secondary | ICD-10-CM

## 2014-12-14 DIAGNOSIS — R928 Other abnormal and inconclusive findings on diagnostic imaging of breast: Secondary | ICD-10-CM

## 2014-12-20 ENCOUNTER — Other Ambulatory Visit: Payer: Self-pay

## 2014-12-26 ENCOUNTER — Other Ambulatory Visit: Payer: Self-pay

## 2014-12-26 ENCOUNTER — Ambulatory Visit
Admission: RE | Admit: 2014-12-26 | Discharge: 2014-12-26 | Disposition: A | Discharge status: Home / Self Care | Payer: 59 | Source: Ambulatory Visit | Attending: Obstetrics and Gynecology | Admitting: Obstetrics and Gynecology

## 2014-12-26 DIAGNOSIS — N6310 Unspecified lump in the right breast, unspecified quadrant: Secondary | ICD-10-CM

## 2014-12-26 DIAGNOSIS — R928 Other abnormal and inconclusive findings on diagnostic imaging of breast: Secondary | ICD-10-CM

## 2016-03-25 ENCOUNTER — Other Ambulatory Visit: Payer: Self-pay | Admitting: General Surgery

## 2016-03-26 ENCOUNTER — Other Ambulatory Visit: Payer: Self-pay | Admitting: General Surgery

## 2016-03-26 DIAGNOSIS — K469 Unspecified abdominal hernia without obstruction or gangrene: Secondary | ICD-10-CM

## 2016-04-01 ENCOUNTER — Ambulatory Visit
Admission: RE | Admit: 2016-04-01 | Discharge: 2016-04-01 | Disposition: A | Discharge status: Home / Self Care | Payer: 59 | Source: Ambulatory Visit | Attending: General Surgery | Admitting: General Surgery

## 2016-04-01 DIAGNOSIS — K469 Unspecified abdominal hernia without obstruction or gangrene: Secondary | ICD-10-CM

## 2016-04-01 MED ORDER — IOPAMIDOL (ISOVUE-300) INJECTION 61%: 100.0000 mL | Freq: Once | INTRAVENOUS | Status: DC | PRN

## 2017-03-09 ENCOUNTER — Encounter (HOSPITAL_COMMUNITY): Payer: Self-pay

## 2017-03-09 ENCOUNTER — Other Ambulatory Visit: Payer: Self-pay

## 2017-03-12 ENCOUNTER — Other Ambulatory Visit: Payer: Self-pay | Admitting: Obstetrics and Gynecology

## 2017-03-18 NOTE — H&P (Signed)
Admission History and Physical Exam for a Gynecology Patient  [name] is a 41 y.o. female, [phone], who presents for a conization of the cervix. Her Pap of Oct 2018 showed ASCUS with HR HPV. She had the same in 2017. Her colposcopy and biopsy was benign in 2017. 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  OB History    Gravida Para Term Preterm AB Living   6 6 6     3    SAB TAB Ectopic Multiple Live Births           3      Past Medical History:  Diagnosis Date  . Abnormal Pap smear   . Anemia 01/17/01  . Anemia   . Dysuria 07/05/97   postcoital  . First trimester bleeding 04/07/00  . GBS (group B streptococcus) infection 2002  . GERD (gastroesophageal reflux disease)   . H/O candidiasis   . H/O cystitis   . H/O hemorrhoids 01/17/01  . H/O herpes simplex infection   2003   . H/O low back pain 05/24/02  . H/O varicella   . Hemorrhoid   . Herpes   . Hx: UTI (urinary tract infection) 07/05/97  . Irregular menses 06/03/01  . Monilial vaginitis 05/24/02  . Pelvic pain 03/14/03  . Pelvic pain   . Trichomonas     No medications prior to admission.    Past Surgical History:  Procedure Laterality Date  . CESAREAN SECTION  2002 & 2006  . CESAREAN SECTION    . [place] EXTRACTION  1998  . WISDOM TOOTH EXTRACTION      Allergies  Allergen Reactions  . Tylox [Oxycodone-Acetaminophen] Palpitations    Family History: family history includes Arthritis in her maternal grandmother; Cancer in her maternal grandfather, paternal aunt, and paternal uncle; Gout in her maternal grandmother; Heart disease in her father; Hyperlipidemia in her father; Hypertension in her brother and maternal grandmother; Muscular dystrophy in her sister; Stroke in her maternal grandmother.  Social History:  reports that  has never smoked. she has never used smokeless  tobacco. She reports that she drinks alcohol. She reports that she does not use drugs.  Review of systems: See HPI.  Admission Physical Exam:    BMI equals 35.6  There were no vitals taken for this visit.  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  She understands  the indications for her surgical procedure as well as the alternative treatment options.  She accepts the risk of, but not limited to, anesthetic complications, bleeding, infections, and possible damage to the surrounding organs.   Eli Hose 03/18/2017

## 2017-03-19 ENCOUNTER — Encounter (HOSPITAL_COMMUNITY)
Admission: RE | Disposition: A | Discharge status: Home / Self Care | Payer: Self-pay | Source: Ambulatory Visit | Attending: Obstetrics and Gynecology

## 2017-03-19 ENCOUNTER — Ambulatory Visit (HOSPITAL_COMMUNITY): Payer: PRIVATE HEALTH INSURANCE | Admitting: Anesthesiology

## 2017-03-19 ENCOUNTER — Ambulatory Visit (HOSPITAL_COMMUNITY)
Admission: RE | Admit: 2017-03-19 | Discharge: 2017-03-19 | Disposition: A | Discharge status: Home / Self Care | Payer: PRIVATE HEALTH INSURANCE | Source: Ambulatory Visit | Attending: Obstetrics and Gynecology | Admitting: Obstetrics and Gynecology

## 2017-03-19 ENCOUNTER — Other Ambulatory Visit: Payer: Self-pay

## 2017-03-19 ENCOUNTER — Encounter (HOSPITAL_COMMUNITY): Payer: Self-pay | Admitting: *Deleted

## 2017-03-19 DIAGNOSIS — K219 Gastro-esophageal reflux disease without esophagitis: Secondary | ICD-10-CM | POA: Insufficient documentation

## 2017-03-19 DIAGNOSIS — Z885 Allergy status to narcotic agent status: Secondary | ICD-10-CM | POA: Diagnosis not present

## 2017-03-19 DIAGNOSIS — Z87440 Personal history of urinary (tract) infections: Secondary | ICD-10-CM | POA: Diagnosis not present

## 2017-03-19 DIAGNOSIS — D060 Carcinoma in situ of endocervix: Secondary | ICD-10-CM | POA: Insufficient documentation

## 2017-03-19 DIAGNOSIS — B009 Herpesviral infection, unspecified: Secondary | ICD-10-CM | POA: Insufficient documentation

## 2017-03-19 DIAGNOSIS — Z9889 Other specified postprocedural states: Secondary | ICD-10-CM | POA: Diagnosis not present

## 2017-03-19 DIAGNOSIS — Z975 Presence of (intrauterine) contraceptive device: Secondary | ICD-10-CM | POA: Insufficient documentation

## 2017-03-19 DIAGNOSIS — Z79899 Other long term (current) drug therapy: Secondary | ICD-10-CM | POA: Diagnosis not present

## 2017-03-19 DIAGNOSIS — N871 Moderate cervical dysplasia: Secondary | ICD-10-CM | POA: Diagnosis present

## 2017-03-19 HISTORY — PX: CERVICAL CONIZATION W/BX: SHX1330

## 2017-03-19 HISTORY — DX: Gastro-esophageal reflux disease without esophagitis: K21.9

## 2017-03-19 LAB — CBC
HCT: 42.8 % (ref 36.0–46.0)
Hemoglobin: 13.8 g/dL (ref 12.0–15.0)
MCH: 29.4 pg (ref 26.0–34.0)
MCHC: 32.2 g/dL (ref 30.0–36.0)
MCV: 91.1 fL (ref 78.0–100.0)
PLATELETS: 254 10*3/uL (ref 150–400)
RBC: 4.7 MIL/uL (ref 3.87–5.11)
RDW: 13.6 % (ref 11.5–15.5)
WBC: 10.3 10*3/uL (ref 4.0–10.5)

## 2017-03-19 LAB — PREGNANCY, URINE: PREG TEST UR: NEGATIVE

## 2017-03-19 SURGERY — CONE BIOPSY, CERVIX
Anesthesia: General

## 2017-03-19 MED ORDER — PROPOFOL 10 MG/ML IV BOLUS
INTRAVENOUS | Status: DC | PRN
  Administered 2017-03-19: 250 mg via INTRAVENOUS

## 2017-03-19 MED ORDER — SCOPOLAMINE 1 MG/3DAYS TD PT72
1.0000 | MEDICATED_PATCH | Freq: Once | TRANSDERMAL | Status: DC
  Administered 2017-03-19: 1.5 mg via TRANSDERMAL

## 2017-03-19 MED ORDER — ACETIC ACID 5 % SOLN
Status: AC
  Filled 2017-03-19: qty 500

## 2017-03-19 MED ORDER — DEXAMETHASONE SODIUM PHOSPHATE 4 MG/ML IJ SOLN
INTRAMUSCULAR | Status: DC | PRN
  Administered 2017-03-19: 8 mg via INTRAVENOUS

## 2017-03-19 MED ORDER — LIDOCAINE HCL (CARDIAC) 20 MG/ML IV SOLN
INTRAVENOUS | Status: DC | PRN
  Administered 2017-03-19: 100 mg via INTRAVENOUS

## 2017-03-19 MED ORDER — KETOROLAC TROMETHAMINE 30 MG/ML IJ SOLN
INTRAMUSCULAR | Status: DC | PRN
  Administered 2017-03-19: 30 mg via INTRAVENOUS

## 2017-03-19 MED ORDER — SODIUM CHLORIDE 0.9 % IJ SOLN
INTRAMUSCULAR | Status: AC
  Filled 2017-03-19: qty 100

## 2017-03-19 MED ORDER — LACTATED RINGERS IV SOLN
INTRAVENOUS | Status: DC
  Administered 2017-03-19 (×2): via INTRAVENOUS

## 2017-03-19 MED ORDER — MIDAZOLAM HCL 2 MG/2ML IJ SOLN
INTRAMUSCULAR | Status: DC | PRN
  Administered 2017-03-19 (×2): 1 mg via INTRAVENOUS

## 2017-03-19 MED ORDER — VASOPRESSIN 20 UNIT/ML IV SOLN
INTRAVENOUS | Status: AC
  Filled 2017-03-19: qty 1

## 2017-03-19 MED ORDER — FENTANYL CITRATE (PF) 100 MCG/2ML IJ SOLN
INTRAMUSCULAR | Status: DC | PRN
  Administered 2017-03-19 (×2): 50 ug via INTRAVENOUS

## 2017-03-19 MED ORDER — LIDOCAINE HCL (CARDIAC) 20 MG/ML IV SOLN
INTRAVENOUS | Status: AC
  Filled 2017-03-19: qty 5

## 2017-03-19 MED ORDER — FERRIC SUBSULFATE 259 MG/GM EX SOLN
CUTANEOUS | Status: AC
  Filled 2017-03-19: qty 8

## 2017-03-19 MED ORDER — MIDAZOLAM HCL 2 MG/2ML IJ SOLN
INTRAMUSCULAR | Status: AC
  Filled 2017-03-19: qty 2

## 2017-03-19 MED ORDER — IODINE STRONG (LUGOLS) 5 % PO SOLN
ORAL | Status: DC | PRN
  Administered 2017-03-19: 0.2 mL

## 2017-03-19 MED ORDER — BUPIVACAINE-EPINEPHRINE (PF) 0.5% -1:200000 IJ SOLN
INTRAMUSCULAR | Status: AC
  Filled 2017-03-19: qty 30

## 2017-03-19 MED ORDER — HYDROMORPHONE HCL 1 MG/ML IJ SOLN
INTRAMUSCULAR | Status: AC
  Administered 2017-03-19: 0.25 mg via INTRAVENOUS
  Filled 2017-03-19: qty 0.5

## 2017-03-19 MED ORDER — DEXAMETHASONE SODIUM PHOSPHATE 10 MG/ML IJ SOLN
INTRAMUSCULAR | Status: AC
  Filled 2017-03-19: qty 1

## 2017-03-19 MED ORDER — IODINE STRONG (LUGOLS) 5 % PO SOLN
ORAL | Status: AC
  Filled 2017-03-19: qty 1

## 2017-03-19 MED ORDER — HYDROMORPHONE HCL 1 MG/ML IJ SOLN
0.2500 mg | INTRAMUSCULAR | Status: DC | PRN
  Administered 2017-03-19 (×2): 0.25 mg via INTRAVENOUS

## 2017-03-19 MED ORDER — ONDANSETRON HCL 4 MG/2ML IJ SOLN
INTRAMUSCULAR | Status: AC
  Filled 2017-03-19: qty 2

## 2017-03-19 MED ORDER — IBUPROFEN 800 MG PO TABS: 800.0000 mg | ORAL_TABLET | Freq: Three times a day (TID) | ORAL | 1 refills | Status: DC | PRN

## 2017-03-19 MED ORDER — PROPOFOL 10 MG/ML IV BOLUS
INTRAVENOUS | Status: AC
  Filled 2017-03-19: qty 20

## 2017-03-19 MED ORDER — BUPIVACAINE-EPINEPHRINE 0.5% -1:200000 IJ SOLN
INTRAMUSCULAR | Status: DC | PRN
  Administered 2017-03-19: 20 mL

## 2017-03-19 MED ORDER — ONDANSETRON HCL 4 MG/2ML IJ SOLN
INTRAMUSCULAR | Status: DC | PRN
  Administered 2017-03-19: 4 mg via INTRAVENOUS

## 2017-03-19 MED ORDER — KETOROLAC TROMETHAMINE 30 MG/ML IJ SOLN
INTRAMUSCULAR | Status: AC
  Filled 2017-03-19: qty 1

## 2017-03-19 MED ORDER — SCOPOLAMINE 1 MG/3DAYS TD PT72
MEDICATED_PATCH | TRANSDERMAL | Status: AC
  Filled 2017-03-19: qty 1

## 2017-03-19 MED ORDER — FENTANYL CITRATE (PF) 250 MCG/5ML IJ SOLN
INTRAMUSCULAR | Status: AC
  Filled 2017-03-19: qty 5

## 2017-03-19 MED ORDER — MEPERIDINE HCL 25 MG/ML IJ SOLN: 6.2500 mg | INTRAMUSCULAR | Status: DC | PRN

## 2017-03-19 MED ORDER — PROMETHAZINE HCL 25 MG/ML IJ SOLN: 6.2500 mg | INTRAMUSCULAR | Status: DC | PRN

## 2017-03-19 SURGICAL SUPPLY — 26 items
APPLICATOR COTTON TIP 6IN STRL (MISCELLANEOUS) ×4 IMPLANT
BLADE SURG 11 STRL SS (BLADE) ×3 IMPLANT
CATH ROBINSON RED A/P 16FR (CATHETERS) ×3 IMPLANT
CLEANER TIP ELECTROSURG 2X2 (MISCELLANEOUS) ×3 IMPLANT
CONTAINER PREFILL 10% NBF 60ML (FORM) ×3 IMPLANT
ELECT REM PT RETURN 9FT ADLT (ELECTROSURGICAL) ×3
ELECTRODE REM PT RTRN 9FT ADLT (ELECTROSURGICAL) IMPLANT
GLOVE BIOGEL PI IND STRL 7.0 (GLOVE) ×1 IMPLANT
GLOVE BIOGEL PI IND STRL 8.5 (GLOVE) ×1 IMPLANT
GLOVE BIOGEL PI INDICATOR 7.0 (GLOVE) ×2
GLOVE BIOGEL PI INDICATOR 8.5 (GLOVE) ×2
GLOVE ECLIPSE 8.0 STRL XLNG CF (GLOVE) ×3 IMPLANT
GOWN STRL REUS W/TWL LRG LVL3 (GOWN DISPOSABLE) ×6 IMPLANT
NS IRRIG 1000ML POUR BTL (IV SOLUTION) ×3 IMPLANT
PACK VAGINAL MINOR WOMEN LF (CUSTOM PROCEDURE TRAY) ×3 IMPLANT
PAD OB MATERNITY 4.3X12.25 (PERSONAL CARE ITEMS) ×3 IMPLANT
PENCIL SMOKE EVAC W/HOLSTER (ELECTROSURGICAL) ×2 IMPLANT
SCOPETTES 8  STERILE (MISCELLANEOUS) ×4
SCOPETTES 8 STERILE (MISCELLANEOUS) ×2 IMPLANT
SPONGE SURGIFOAM ABS GEL 12-7 (HEMOSTASIS) ×2 IMPLANT
SUT VICRYL 0 UR6 27IN ABS (SUTURE) ×8 IMPLANT
SYR TB 1ML 25GX5/8 (SYRINGE) IMPLANT
TOWEL OR 17X24 6PK STRL BLUE (TOWEL DISPOSABLE) ×6 IMPLANT
TUBING NON-CON 1/4 X 20 CONN (TUBING) ×1 IMPLANT
TUBING NON-CON 1/4 X 20' CONN (TUBING) ×1
YANKAUER SUCT BULB TIP NO VENT (SUCTIONS) ×2 IMPLANT

## 2017-03-19 NOTE — Anesthesia Preprocedure Evaluation (Signed)
Anesthesia Evaluation  Patient identified by MRN, date of birth, ID band Patient awake    Reviewed: Allergy & Precautions, NPO status , Patient's Chart, lab work & pertinent test results  Airway Mallampati: II  TM Distance: >3 FB Neck ROM: Full    Dental no notable dental hx.    Pulmonary neg pulmonary ROS,    Pulmonary exam normal breath sounds clear to auscultation       Cardiovascular negative cardio ROS Normal cardiovascular exam Rhythm:Regular Rate:Normal     Neuro/Psych negative neurological ROS  negative psych ROS   GI/Hepatic negative GI ROS, Neg liver ROS, GERD  ,  Endo/Other  negative endocrine ROS  Renal/GU negative Renal ROS  negative genitourinary   Musculoskeletal negative musculoskeletal ROS (+)   Abdominal (+) + obese,   Peds negative pediatric ROS (+)  Hematology negative hematology ROS (+)   Anesthesia Other Findings   Reproductive/Obstetrics negative OB ROS                             Anesthesia Physical Anesthesia Plan  ASA: II  Anesthesia Plan: General   Post-op Pain Management:    Induction: Intravenous  PONV Risk Score and Plan: 3 and Ondansetron, Dexamethasone and Midazolam  Airway Management Planned: LMA  Additional Equipment:   Intra-op Plan:   Post-operative Plan: Extubation in OR  Informed Consent: I have reviewed the patients History and Physical, chart, labs and discussed the procedure including the risks, benefits and alternatives for the proposed anesthesia with the patient or authorized representative who has indicated his/her understanding and acceptance.   Dental advisory given  Plan Discussed with: CRNA  Anesthesia Plan Comments:         Anesthesia Quick Evaluation

## 2017-03-19 NOTE — Op Note (Signed)
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  Assistant:  None  Anesthetic:  Choice  Disposition:  The patient presents with a history of cervical intraepithelial neoplasia of the endocervix diagnosed by colposcopy and biopsies. She understands the indications for her cold by conization of the cervix as well as the alternative treatment options. She accepts the risk of, but not limited to, anesthetic complications, bleeding, infections, and possible damage to the surrounding organs.  Findings:  The uterus was upper limits normal size.  No adnexal masses were appreciated.  There is no parametrial thickening.  The IUD string was initially not seen at the cervix.  The cervix was stained with Lugol's solution.  There were no nonstaining areas noted.  Care was taken not to remove the IUD or to cut the IUD strings.  At the end of our procedure, the IUD string could be seen at the cervix.  Procedure:  The patient was taken to the operating room where a general anesthetic was given. A timeout was performed identifying the patient and the procedure. The patient was placed in a lithotomy position. The perineum and vagina were prepped with multiple layers of Betadine. The bladder was drained of urine. The patient was sterilely draped. An examination under anesthesia was performed. A paracervical block was placed using 10 cc of half percent Marcaine with epinephrine. Lugol solution was placed on the cervix. The cervix was then injected with 10 cc of half percent Marcaine with epinephrine 1-200,000. Angle sutures were placed at the 3:00 and  9:00 positions. The endocervix was sounded. A circumferential incision was made around the cervical os incorporating all nonstaining areas from the Lugol solution. The incision was continued in a conelike fashion to the endocervix. A stitch was placed at the 12:00 position of the conization specimen. The specimen was sent to pathology. Inverting sutures were placed at the 12:00, 3:00, 6:00, and 9:00 positions. Hemostasis was adequate. Examination was repeated. The cervix was firm. All instruments were removed from the vagina. The patient was returned to the supine position. She was awakened from her anesthetic without difficulty. She was transported to the recovery room in stable condition. Sponge and needle counts were correct. The estimated blood loss for the procedure was 20 cc. The conization specimen was sent to pathology with a stitch at the 12 o'clock position.  Followup instructions:  The patient will return to see Dr. Raphael Gibney in 2 weeks for follow-up examination. She will call for questions or concerns. She will call for complications.  Discharge medications:  Motrin 800 mg every 8 hours as needed for mild to moderate pain  Gildardo Cranker, M.D.  03/19/2017

## 2017-03-19 NOTE — Anesthesia Procedure Notes (Signed)
Procedure Name: LMA Insertion Date/Time: 03/19/2017 11:15 AM Performed by: Georgeanne Nim, CRNA Pre-anesthesia Checklist: Patient identified, Emergency Drugs available, Suction available, Patient being monitored and Timeout performed Patient Re-evaluated:Patient Re-evaluated prior to induction Oxygen Delivery Method: Circle system utilized Preoxygenation: Pre-oxygenation with 100% oxygen Induction Type: IV induction Ventilation: Mask ventilation without difficulty LMA: LMA inserted LMA Size: 4.0 Number of attempts: 1 Airway Equipment and Method: Patient positioned with wedge pillow Placement Confirmation: positive ETCO2,  CO2 detector and breath sounds checked- equal and bilateral Tube secured with: Tape Dental Injury: Teeth and Oropharynx as per pre-operative assessment

## 2017-03-19 NOTE — Discharge Instructions (Signed)
Cervical Conization, Care After °This sheet gives you information about how to care for yourself after your procedure. Your doctor may also give you more specific instructions. If you have problems or questions, contact your doctor. °Follow these instructions at home: °Medicines °· Take over-the-counter and prescription medicines only as told by your doctor. °· Do not take aspirin until your doctor says it is okay. °· If you take pain medicine: °? You may have constipation. To help treat this, your doctor may tell you to: °§ Drink enough fluid to keep your pee (urine) clear or pale yellow. °§ Take medicines. °§ Eat foods that are high in fiber. These include fresh fruits and vegetables, whole grains, bran, and beans. °§ Limit foods that are high in fat and sugar. These include fried foods and sweet foods. °? Do not drive or use heavy machines. °General instructions °· You can eat your usual diet unless your doctor tells you not to do so. °· Take showers for the first week. Do not take baths, swim, or use hot tubs until your doctor says it is okay. °· Do not douche, use tampons, or have sex until your doctor says it is okay. °· For 7-14 days after your procedure, avoid: °? Being very active. °? Exercising. °? Heavy lifting. °· Keep all follow-up visits as told by your doctor. This is important. °Contact a doctor if: °· You have a rash. °· You are dizzy or lightheaded. °· You feel sick to your stomach (nauseous). °· You throw up (vomit). °· You have fluid from your vagina (vaginal discharge) that smells bad. °Get help right away if: °· There are blood clots coming from your vagina. °· You have more bleeding than you would have in a normal period. For example, you soak a pad in less than 1 hour. °· You have a fever. °· You have more and more cramps. °· You pass out (faint). °· You have pain when peeing. °· Your have a lot of pain. °· Your pain gets worse. °· Your pain does not get better when you take your  medicine. °· You have blood in your pee. °· You throw up (vomit). °Summary °· After your procedure, take over-the-counter and prescription medicines only as told by your doctor. °· Do not douche, use tampons, or have sex until your doctor says it is okay. °· For about 7-14 days after your procedure, try not to exercise or lift heavy objects. °· Get help right away if you have new symptoms, or if your symptoms become worse. °This information is not intended to replace advice given to you by your health care provider. Make sure you discuss any questions you have with your health care provider. °Document Released: 12/31/2007 Document Revised: 03/25/2016 Document Reviewed: 03/25/2016 °Elsevier Interactive Patient Education © 2017 [employer] Inc. ° °

## 2017-03-19 NOTE — Progress Notes (Signed)
The patient was interviewed and examined today.  The previously documented history and physical examination was reviewed. There are no changes. The operative procedure was reviewed. The risks and benefits were outlined again. The specific risks include, but are not limited to, anesthetic complications, bleeding, infections, and possible damage to the surrounding organs. The patient's questions were answered.  We are ready to proceed as outlined. The likelihood of the patient achieving the goals of this procedure is very likely.   BP 137/81   Pulse 94   Temp 98 F (36.7 C) (Oral)   Resp 16   Ht 5\' 6"  (1.676 m)   Wt 98 kg (216 lb)   SpO2 100%   BMI 34.86 kg/m   CBC    Component Value Date/Time   WBC 10.3 03/19/2017 0940   RBC 4.70 03/19/2017 0940   HGB 13.8 03/19/2017 0940   HCT 42.8 03/19/2017 0940   PLT 254 03/19/2017 0940   MCV 91.1 03/19/2017 0940   MCH 29.4 03/19/2017 0940   MCHC 32.2 03/19/2017 0940   RDW 13.6 03/19/2017 0940   Results for orders placed or performed during the hospital encounter of 03/19/17 (from the past 24 hour(s))  CBC     Status: None   Collection Time: 03/19/17  9:40 AM  Result Value Ref Range   WBC 10.3 4.0 - 10.5 K/uL   RBC 4.70 3.87 - 5.11 MIL/uL   Hemoglobin 13.8 12.0 - 15.0 g/dL   HCT 42.8 36.0 - 46.0 %   MCV 91.1 78.0 - 100.0 fL   MCH 29.4 26.0 - 34.0 pg   MCHC 32.2 30.0 - 36.0 g/dL   RDW 13.6 11.5 - 15.5 %   Platelets 254 150 - 400 K/uL  Pregnancy, urine     Status: None   Collection Time: 03/19/17  9:44 AM  Result Value Ref Range   Preg Test, Ur NEGATIVE NEGATIVE     Gildardo Cranker, M.D.

## 2017-03-19 NOTE — Anesthesia Postprocedure Evaluation (Signed)
Anesthesia Post Note  Patient: Lindsey Harrington  Procedure(s) Performed: CONIZATION CERVIX WITH BIOPSY (N/A )     Patient location during evaluation: PACU Anesthesia Type: General Level of consciousness: awake and alert Pain management: pain level controlled Vital Signs Assessment: post-procedure vital signs reviewed and stable Respiratory status: spontaneous breathing, nonlabored ventilation and respiratory function stable Cardiovascular status: blood pressure returned to baseline and stable Postop Assessment: no apparent nausea or vomiting Anesthetic complications: no    Last Vitals:  Vitals:   03/19/17 1315 03/19/17 1400  BP: (!) 119/59 (!) 148/59  Pulse: 95 96  Resp: 13 16  Temp:  36.5 C  SpO2: 98% 97%    Last Pain:  Vitals:   03/19/17 1315  TempSrc:   PainSc: 2    Pain Goal: Patients Stated Pain Goal: 3 (03/19/17 0956)               Lynda Rainwater

## 2017-03-19 NOTE — Transfer of Care (Signed)
Immediate Anesthesia Transfer of Care Note  Patient: Lindsey Harrington  Procedure(s) Performed: CONIZATION CERVIX WITH BIOPSY (N/A )  Patient Location: PACU  Anesthesia Type:General  Level of Consciousness: awake and patient cooperative  Airway & Oxygen Therapy: Patient Spontanous Breathing and Patient connected to nasal cannula oxygen  Post-op Assessment: Report given to RN and Post -op Vital signs reviewed and stable  Post vital signs: Reviewed and stable  Last Vitals:  Vitals:   03/19/17 0956  BP: 137/81  Pulse: 94  Resp: 16  Temp: 36.7 C  SpO2: 100%    Last Pain:  Vitals:   03/19/17 0956  TempSrc: Oral      Patients Stated Pain Goal: 3 (54/36/06 7703)  Complications: No apparent anesthesia complications

## 2017-03-20 ENCOUNTER — Encounter (HOSPITAL_COMMUNITY): Payer: Self-pay | Admitting: Obstetrics and Gynecology

## 2017-06-29 ENCOUNTER — Other Ambulatory Visit: Payer: Self-pay | Admitting: Obstetrics and Gynecology

## 2018-02-03 ENCOUNTER — Other Ambulatory Visit: Payer: Self-pay | Admitting: Obstetrics and Gynecology

## 2018-03-02 ENCOUNTER — Other Ambulatory Visit: Payer: Self-pay | Admitting: Obstetrics and Gynecology

## 2018-03-23 ENCOUNTER — Encounter (HOSPITAL_BASED_OUTPATIENT_CLINIC_OR_DEPARTMENT_OTHER): Payer: Self-pay | Admitting: *Deleted

## 2018-03-23 ENCOUNTER — Other Ambulatory Visit: Payer: Self-pay

## 2018-03-23 NOTE — Progress Notes (Addendum)
Spoke with patient via telephone for pre op interview. NPO 6 hours before. Can have clear liquids until 6 hours pre op (0600). Explained no milk products, pt verbalized understanding. Patient to take Aciphex AM of surgery. Arrival time 1200. Needs UPT AM of surgery.

## 2018-03-31 NOTE — H&P (Signed)
Admission History and Physical Exam for a Gynecology Patient  [name] is a 42 y.o. female, [phone], who presents for a cold knife conization. She has been followed at the [facility] and Gynecology. Her Pap of Oct 2018 showed ASCUS with HR HPV. 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 On 03/19/2017 she had a CKC.  The conization path showed CIN 2/3 with negative ectocervical margins, but positive endocervical margin of CIN 2. Colpo and biopsies in March 2019 showed benign ectocervix and benign endocervix.  Her Pap of 01/20/18 was normal, but no endocervical cells were seen. An ECC was done that showed CIN 2. She presents for a repeat conization.  OB History    Gravida  6   Para  6   Term  6   Preterm      AB      Living  3     SAB      TAB      Ectopic      Multiple      Live Births  3           Past Medical History:  Diagnosis Date  . Abnormal Pap smear   . Anemia 01/17/01  . Anemia   . Dysuria 07/05/97   postcoital  . First trimester bleeding 04/07/00  . GBS (group B streptococcus) infection 2002  . GERD (gastroesophageal reflux disease)   . H/O candidiasis   . H/O cystitis   . H/O hemorrhoids 01/17/01  . H/O herpes simplex infection   2003   . H/O low back pain 05/24/02  . H/O varicella   . Hemorrhoid   . Herpes   . Hx: UTI (urinary tract infection) 07/05/97  . Irregular menses 06/03/01  . Monilial vaginitis 05/24/02  . Pelvic pain 03/14/03  . Pelvic pain   . Trichomonas     Meds:  Vit D Mirena IUD Valacyclovir Rabeprazole 20 mg daily  Past Surgical History:  Procedure Laterality Date  . CERVICAL CONIZATION W/BX N/A 03/19/2017   Procedure: CONIZATION CERVIX WITH BIOPSY;  Surgeon: Ena Dawley, MD;  Location: [facility];  Service: Gynecology;  Laterality: N/A;  . CESAREAN SECTION  2002 & 2006  . CESAREAN SECTION    . [place] EXTRACTION  1998  . WISDOM TOOTH EXTRACTION       Allergies  Allergen Reactions  . Tylox [Oxycodone-Acetaminophen] Other (See Comments)    Sever shaking and dizziness    Family History: family history includes Arthritis in her maternal grandmother; Cancer in her maternal grandfather, paternal aunt, and paternal uncle; Gout in her maternal grandmother; Heart disease in her father; Hyperlipidemia in her father; Hypertension in her brother and maternal grandmother; Muscular dystrophy in her sister; Stroke in her maternal grandmother.  Social History:  reports that she has never smoked. She has never used smokeless tobacco. She reports current alcohol use. She reports that she does not use drugs.  Review of systems: See HPI.  Admission Physical Exam:    Body mass index is 35.02 kg/m.  Height 5\' 6"  (1.676 m), weight 98.4 kg, last menstrual period 03/06/2018.  HEENT:                 Within normal limits Chest:                   Clear Heart:  Regular rate and rhythm Breasts:                No masses, skin changes, bleeding, or discharge present Abdomen:             Nontender, no masses Extremities:          Grossly normal Neurologic exam: Grossly normal  Pelvic exam:  External genitalia: normal general appearance Vaginal: normal without tenderness, induration or masses Cervix: normal appearance Adnexa: normal bimanual exam Uterus: Normal size  Assessment:  CIN 2 of the endocervix BMI = 36  Plan:  CKC   Eli Hose 03/31/2018

## 2018-04-01 ENCOUNTER — Encounter (HOSPITAL_BASED_OUTPATIENT_CLINIC_OR_DEPARTMENT_OTHER): Payer: Self-pay | Admitting: *Deleted

## 2018-04-01 ENCOUNTER — Ambulatory Visit (HOSPITAL_BASED_OUTPATIENT_CLINIC_OR_DEPARTMENT_OTHER): Payer: BLUE CROSS/BLUE SHIELD | Admitting: Anesthesiology

## 2018-04-01 ENCOUNTER — Ambulatory Visit (HOSPITAL_BASED_OUTPATIENT_CLINIC_OR_DEPARTMENT_OTHER)
Admission: RE | Admit: 2018-04-01 | Discharge: 2018-04-01 | Disposition: A | Discharge status: Home / Self Care | Payer: BLUE CROSS/BLUE SHIELD | Attending: Obstetrics and Gynecology | Admitting: Obstetrics and Gynecology

## 2018-04-01 ENCOUNTER — Other Ambulatory Visit: Payer: Self-pay

## 2018-04-01 ENCOUNTER — Encounter (HOSPITAL_BASED_OUTPATIENT_CLINIC_OR_DEPARTMENT_OTHER)
Admission: RE | Disposition: A | Discharge status: Home / Self Care | Payer: Self-pay | Source: Home / Self Care | Attending: Obstetrics and Gynecology

## 2018-04-01 DIAGNOSIS — D069 Carcinoma in situ of cervix, unspecified: Secondary | ICD-10-CM | POA: Insufficient documentation

## 2018-04-01 DIAGNOSIS — Z975 Presence of (intrauterine) contraceptive device: Secondary | ICD-10-CM | POA: Insufficient documentation

## 2018-04-01 DIAGNOSIS — K219 Gastro-esophageal reflux disease without esophagitis: Secondary | ICD-10-CM | POA: Diagnosis not present

## 2018-04-01 DIAGNOSIS — Z8249 Family history of ischemic heart disease and other diseases of the circulatory system: Secondary | ICD-10-CM | POA: Diagnosis not present

## 2018-04-01 DIAGNOSIS — Z87440 Personal history of urinary (tract) infections: Secondary | ICD-10-CM | POA: Insufficient documentation

## 2018-04-01 DIAGNOSIS — N871 Moderate cervical dysplasia: Secondary | ICD-10-CM | POA: Diagnosis present

## 2018-04-01 HISTORY — PX: CERVICAL CONIZATION W/BX: SHX1330

## 2018-04-01 LAB — POCT PREGNANCY, URINE: Preg Test, Ur: NEGATIVE

## 2018-04-01 SURGERY — CONE BIOPSY, CERVIX
Anesthesia: General

## 2018-04-01 MED ORDER — IODINE STRONG (LUGOLS) 5 % PO SOLN
ORAL | Status: DC | PRN
  Administered 2018-04-01: 0.2 mL

## 2018-04-01 MED ORDER — KETOROLAC TROMETHAMINE 30 MG/ML IJ SOLN
INTRAMUSCULAR | Status: DC | PRN
  Administered 2018-04-01: 30 mg via INTRAVENOUS
  Administered 2018-04-01: 30 mg via INTRAMUSCULAR

## 2018-04-01 MED ORDER — ONDANSETRON HCL 4 MG/2ML IJ SOLN
INTRAMUSCULAR | Status: DC | PRN
  Administered 2018-04-01: 4 mg via INTRAVENOUS

## 2018-04-01 MED ORDER — PROPOFOL 10 MG/ML IV BOLUS
INTRAVENOUS | Status: DC | PRN
  Administered 2018-04-01: 200 mg via INTRAVENOUS

## 2018-04-01 MED ORDER — ONDANSETRON HCL 4 MG PO TABS: 4.0000 mg | ORAL_TABLET | Freq: Three times a day (TID) | ORAL | 0 refills | Status: DC | PRN

## 2018-04-01 MED ORDER — LIDOCAINE 2% (20 MG/ML) 5 ML SYRINGE
INTRAMUSCULAR | Status: AC
  Filled 2018-04-01: qty 5

## 2018-04-01 MED ORDER — MEPERIDINE HCL 25 MG/ML IJ SOLN
6.2500 mg | INTRAMUSCULAR | Status: DC | PRN
  Filled 2018-04-01: qty 1

## 2018-04-01 MED ORDER — MIDAZOLAM HCL 2 MG/2ML IJ SOLN
INTRAMUSCULAR | Status: DC | PRN
  Administered 2018-04-01: 2 mg via INTRAVENOUS

## 2018-04-01 MED ORDER — KETOROLAC TROMETHAMINE 30 MG/ML IJ SOLN
INTRAMUSCULAR | Status: AC
  Filled 2018-04-01: qty 1

## 2018-04-01 MED ORDER — BUPIVACAINE-EPINEPHRINE 0.5% -1:200000 IJ SOLN
INTRAMUSCULAR | Status: DC | PRN
  Administered 2018-04-01: 20 mL

## 2018-04-01 MED ORDER — FENTANYL CITRATE (PF) 100 MCG/2ML IJ SOLN
INTRAMUSCULAR | Status: DC | PRN
  Administered 2018-04-01: 50 ug via INTRAVENOUS
  Administered 2018-04-01: 25 ug via INTRAVENOUS

## 2018-04-01 MED ORDER — DEXAMETHASONE SODIUM PHOSPHATE 10 MG/ML IJ SOLN
INTRAMUSCULAR | Status: DC | PRN
  Administered 2018-04-01: 10 mg via INTRAVENOUS

## 2018-04-01 MED ORDER — IBUPROFEN 800 MG PO TABS: 800.0000 mg | ORAL_TABLET | Freq: Three times a day (TID) | ORAL | 1 refills | Status: DC | PRN

## 2018-04-01 MED ORDER — FENTANYL CITRATE (PF) 100 MCG/2ML IJ SOLN
INTRAMUSCULAR | Status: AC
  Filled 2018-04-01: qty 2

## 2018-04-01 MED ORDER — ONDANSETRON HCL 4 MG/2ML IJ SOLN
INTRAMUSCULAR | Status: AC
  Filled 2018-04-01: qty 2

## 2018-04-01 MED ORDER — FENTANYL CITRATE (PF) 100 MCG/2ML IJ SOLN
25.0000 ug | INTRAMUSCULAR | Status: DC | PRN
  Filled 2018-04-01: qty 1

## 2018-04-01 MED ORDER — DEXAMETHASONE SODIUM PHOSPHATE 10 MG/ML IJ SOLN
INTRAMUSCULAR | Status: AC
  Filled 2018-04-01: qty 1

## 2018-04-01 MED ORDER — OXYCODONE HCL 5 MG PO TABS
5.0000 mg | ORAL_TABLET | Freq: Once | ORAL | Status: DC | PRN
  Filled 2018-04-01: qty 1

## 2018-04-01 MED ORDER — OXYCODONE HCL 5 MG/5ML PO SOLN
5.0000 mg | Freq: Once | ORAL | Status: DC | PRN
  Filled 2018-04-01: qty 5

## 2018-04-01 MED ORDER — LIDOCAINE 2% (20 MG/ML) 5 ML SYRINGE
INTRAMUSCULAR | Status: DC | PRN
  Administered 2018-04-01: 60 mg via INTRAVENOUS

## 2018-04-01 MED ORDER — MIDAZOLAM HCL 2 MG/2ML IJ SOLN
INTRAMUSCULAR | Status: AC
  Filled 2018-04-01: qty 2

## 2018-04-01 MED ORDER — PROMETHAZINE HCL 25 MG/ML IJ SOLN
6.2500 mg | INTRAMUSCULAR | Status: DC | PRN
  Filled 2018-04-01: qty 1

## 2018-04-01 MED ORDER — LACTATED RINGERS IV SOLN
INTRAVENOUS | Status: DC
  Administered 2018-04-01: 13:00:00 via INTRAVENOUS
  Filled 2018-04-01: qty 1000

## 2018-04-01 MED ORDER — LACTATED RINGERS IV SOLN
INTRAVENOUS | Status: DC
  Administered 2018-04-01: 16:00:00 via INTRAVENOUS
  Filled 2018-04-01: qty 1000

## 2018-04-01 SURGICAL SUPPLY — 22 items
APPLICATOR COTTON TIP 6IN STRL (MISCELLANEOUS) ×2 IMPLANT
BLADE SURG 11 STRL SS (BLADE) ×3 IMPLANT
CATH ROBINSON RED A/P 16FR (CATHETERS) ×2 IMPLANT
COVER WAND RF STERILE (DRAPES) ×3 IMPLANT
ELECT BALL LEEP 5MM RED (ELECTRODE) ×3 IMPLANT
GLOVE BIOGEL PI IND STRL 8.5 (GLOVE) ×1 IMPLANT
GLOVE BIOGEL PI INDICATOR 8.5 (GLOVE) ×2
GLOVE ECLIPSE 8.0 STRL XLNG CF (GLOVE) ×3 IMPLANT
GOWN STRL REUS W/TWL XL LVL3 (GOWN DISPOSABLE) ×6 IMPLANT
KIT TURNOVER CYSTO (KITS) ×3 IMPLANT
NS IRRIG 500ML POUR BTL (IV SOLUTION) ×3 IMPLANT
PACK VAGINAL WOMENS (CUSTOM PROCEDURE TRAY) ×3 IMPLANT
PAD OB MATERNITY 4.3X12.25 (PERSONAL CARE ITEMS) ×3 IMPLANT
PAD PREP 24X48 CUFFED NSTRL (MISCELLANEOUS) ×3 IMPLANT
PENCIL SMOKE EVAC W/HOLSTER (ELECTROSURGICAL) IMPLANT
SCOPETTES 8  STERILE (MISCELLANEOUS) ×4
SCOPETTES 8 STERILE (MISCELLANEOUS) ×2 IMPLANT
SPONGE SURGIFOAM ABS GEL 12-7 (HEMOSTASIS) ×2 IMPLANT
SUT VIC AB 2-0 UR6 27 (SUTURE) ×6 IMPLANT
SUT VICRYL 0 UR6 27IN ABS (SUTURE) ×6 IMPLANT
TOWEL OR 17X24 6PK STRL BLUE (TOWEL DISPOSABLE) ×6 IMPLANT
WATER STERILE IRR 500ML POUR (IV SOLUTION) ×3 IMPLANT

## 2018-04-01 NOTE — Transfer of Care (Signed)
Immediate Anesthesia Transfer of Care Note  Patient: Lindsey Harrington  Procedure(s) Performed: Procedure(s) (LRB): CONIZATION CERVIX WITH BIOPSY (N/A)  Patient Location: PACU  Anesthesia Type: General  Level of Consciousness: awake, oriented, sedated and patient cooperative  Airway & Oxygen Therapy: Patient Spontanous Breathing and Patient connected to face mask oxygen  Post-op Assessment: Report given to PACU RN and Post -op Vital signs reviewed and stable  Post vital signs: Reviewed and stable  Complications: No apparent anesthesia complications  Last Vitals:  Vitals Value Taken Time  BP 170/87 04/01/2018  3:05 PM  Temp    Pulse 102 04/01/2018  3:07 PM  Resp 20 04/01/2018  3:07 PM  SpO2 98 % 04/01/2018  3:07 PM  Vitals shown include unvalidated device data.  Last Pain:  Vitals:   04/01/18 1222  TempSrc:   PainSc: 0-No pain      Patients Stated Pain Goal: 5 (04/01/18 1222)

## 2018-04-01 NOTE — Anesthesia Preprocedure Evaluation (Addendum)
Anesthesia Evaluation  Patient identified by MRN, date of birth, ID band Patient awake    Reviewed: Allergy & Precautions, NPO status , Patient's Chart, lab work & pertinent test results  Airway Mallampati: III  TM Distance: >3 FB Neck ROM: Full    Dental  (+) Teeth Intact, Dental Advisory Given   Pulmonary neg pulmonary ROS,    Pulmonary exam normal        Cardiovascular  Rhythm:Regular Rate:Normal     Neuro/Psych negative neurological ROS  negative psych ROS   GI/Hepatic Neg liver ROS, GERD  ,  Endo/Other  negative endocrine ROS  Renal/GU negative Renal ROS     Musculoskeletal negative musculoskeletal ROS (+)   Abdominal Normal abdominal exam  (+)   Peds  Hematology   Anesthesia Other Findings   Reproductive/Obstetrics                            Anesthesia Physical Anesthesia Plan  ASA: II  Anesthesia Plan: General   Post-op Pain Management:    Induction: Intravenous  PONV Risk Score and Plan: 4 or greater and Ondansetron, Dexamethasone, Midazolam and Scopolamine patch - Pre-op  Airway Management Planned: LMA  Additional Equipment: None  Intra-op Plan:   Post-operative Plan: Extubation in OR  Informed Consent: I have reviewed the patients History and Physical, chart, labs and discussed the procedure including the risks, benefits and alternatives for the proposed anesthesia with the patient or authorized representative who has indicated his/her understanding and acceptance.   Dental advisory given  Plan Discussed with: CRNA  Anesthesia Plan Comments:        Anesthesia Quick Evaluation

## 2018-04-01 NOTE — Progress Notes (Signed)
The patient was interviewed and examined today.  The previously documented history and physical examination was reviewed. There are no changes. The operative procedure was reviewed. The risks and benefits were outlined again. The specific risks include, but are not limited to, anesthetic complications, bleeding, infections, and possible damage to the surrounding organs. The patient's questions were answered.  We are ready to proceed as outlined. The likelihood of the patient achieving the goals of this procedure is very likely.   BP (!) 142/76   Pulse 82   Temp 98.2 F (36.8 C) (Oral)   Resp 18   Ht 5\' 6"  (1.676 m)   Wt 100.2 kg   LMP 03/06/2018 (Approximate)   SpO2 97%   BMI 35.65 kg/m  CBC    Component Value Date/Time   WBC 10.3 03/19/2017 0940   RBC 4.70 03/19/2017 0940   HGB 13.8 03/19/2017 0940   HCT 42.8 03/19/2017 0940   PLT 254 03/19/2017 0940   MCV 91.1 03/19/2017 0940   MCH 29.4 03/19/2017 0940   MCHC 32.2 03/19/2017 0940   RDW 13.6 03/19/2017 0940   Results for orders placed or performed during the hospital encounter of 04/01/18 (from the past 24 hour(s))  Pregnancy, urine POC     Status: None   Collection Time: 04/01/18 12:20 PM  Result Value Ref Range   Preg Test, Ur NEGATIVE NEGATIVE   Gildardo Cranker, M.D. 04/01/2018

## 2018-04-01 NOTE — Anesthesia Procedure Notes (Signed)
Procedure Name: LMA Insertion Date/Time: 04/01/2018 2:15 PM Performed by: Suan Halter, CRNA Pre-anesthesia Checklist: Patient identified, Emergency Drugs available, Suction available and Patient being monitored Patient Re-evaluated:Patient Re-evaluated prior to induction Oxygen Delivery Method: Circle system utilized Preoxygenation: Pre-oxygenation with 100% oxygen Induction Type: IV induction Ventilation: Mask ventilation without difficulty LMA: LMA inserted LMA Size: 4.0 Number of attempts: 1 Airway Equipment and Method: Bite block Placement Confirmation: positive ETCO2 Tube secured with: Tape Dental Injury: Teeth and Oropharynx as per pre-operative assessment

## 2018-04-01 NOTE — Discharge Instructions (Signed)
Cervical Conization, Care After This sheet gives you information about how to care for yourself after your procedure. Your doctor may also give you more specific instructions. If you have problems or questions, contact your doctor. Follow these instructions at home: Medicines   Take over-the-counter and prescription medicines only as told by your doctor.  Do not take aspirin until your doctor says it is okay.  If you take pain medicine: ? You may have constipation. To help treat this, your doctor may tell you to:  Drink enough fluid to keep your pee (urine) clear or pale yellow.  Take medicines.  Eat foods that are high in fiber. These include fresh fruits and vegetables, whole grains, bran, and beans.  Limit foods that are high in fat and sugar. These include fried foods and sweet foods. ? Do not drive or use heavy machines. General instructions  You can eat your usual diet unless your doctor tells you not to do so.  Take showers for the first week. Do not take baths, swim, or use hot tubs until your doctor says it is okay.  Do not douche, use tampons, or have sex until your doctor says it is okay.  For 7-14 days after your procedure, avoid: ? Being very active. ? Exercising. ? Heavy lifting.  Keep all follow-up visits as told by your doctor. This is important. Contact a doctor if:  You have a rash.  You are dizzy or lightheaded.  You feel sick to your stomach (nauseous).  You throw up (vomit).  You have fluid from your vagina (vaginal discharge) that smells bad. Get help right away if:  There are blood clots coming from your vagina.  You have more bleeding than you would have in a normal period. For example, you soak a pad in less than 1 hour.  You have a fever.  You have more and more cramps.  You pass out (faint).  You have pain when peeing.  Your have a lot of pain.  Your pain gets worse.  Your pain does not get better when you take your  medicine.  You have blood in your pee.  You throw up (vomit). Summary  After your procedure, take over-the-counter and prescription medicines only as told by your doctor.  Do not douche, use tampons, or have sex until your doctor says it is okay.  For about 7-14 days after your procedure, try not to exercise or lift heavy objects.  Get help right away if you have new symptoms, or if your symptoms become worse. This information is not intended to replace advice given to you by your health care provider. Make sure you discuss any questions you have with your health care provider. Document Released: 12/31/2007 Document Revised: 03/25/2016 Document Reviewed: 03/25/2016 Elsevier Interactive Patient Education  2019 [place] [facility] Instructions  Activity: Get plenty of rest for the remainder of the day. A responsible individual must stay with you for 24 hours following the procedure.  For the next 24 hours, DO NOT: -Drive a car -Paediatric nurse -Drink alcoholic beverages -Take any medication unless instructed by your physician -Make any legal decisions or sign important papers.  Meals: Start with liquid foods such as gelatin or soup. Progress to regular foods as tolerated. Avoid greasy, spicy, heavy foods. If nausea and/or vomiting occur, drink only clear liquids until the nausea and/or vomiting subsides. Call your physician if vomiting continues.  Special Instructions/Symptoms: Your throat may feel dry or sore  from the anesthesia or the breathing tube placed in your throat during surgery. If this causes discomfort, gargle with warm salt water. The discomfort should disappear within 24 hours.

## 2018-04-01 NOTE — Op Note (Signed)
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  Assistant:  None  Anesthetic:  Choice  Disposition:  The patient presents with a history of CIN-2 of the endocervix. She understands the indications for her cold by conization of the cervix as well as the alternative treatment options. She accepts the risk of, but not limited to, anesthetic complications, bleeding, infections, and possible damage to the surrounding organs.  Findings:  Uterus is upper limits normal size.  No parametrial or adnexal disease is appreciated.  There is no thickening in the cul-de-sac.  There are no gross lesions present on the cervix.  Procedure:  The patient was taken to the operating room where a general anesthetic was given. A timeout was performed identifying the patient and the procedure. The patient was placed in a lithotomy position. The perineum and vagina were prepped with multiple layers of chlorhexidine with 4% alcohol. The bladder was drained of urine. The patient was sterilely draped. An examination under anesthesia was performed. A paracervical block was placed using 10 cc of half percent Marcaine with epinephrine. Lugol solution was placed on the cervix. The cervix was then injected with 10 cc of half percent Marcaine with epinephrine. Angle sutures were placed at the 3:00 and 9:00 positions. The endocervix was sounded. A circumferential incision was made around the cervical os incorporating all nonstaining areas from the Lugol solution. The incision was continued in a conelike fashion to the endocervix. A stitch was placed at the 12:00  position of the conization specimen. The specimen was sent to pathology. Inverting sutures were placed at the 12:00, 3:00, 6:00, and 9:00 positions. Hemostasis was adequate. Examination was repeated. The cervix was firm. All instruments were removed from the vagina. The patient was returned to the supine position. She was awakened from her anesthetic without difficulty. She was transported to the recovery room in stable condition. Sponge and needle counts were correct. The estimated blood loss for the procedure was 10 cc. The conization specimen was sent to pathology.  Followup instructions:  The patient will return to see Dr. Raphael Gibney in 2 weeks for follow-up examination. She will call for questions or concerns. She will call for complications.  Discharge medications:  Ibuprofen 800 mg 1 tablet every 8 hours as needed for moderate pain Zofran 4 mg 1 tablet every 8 hours as needed for nausea and vomiting  Gildardo Cranker, M.D.  04/01/2018

## 2018-04-02 NOTE — Anesthesia Postprocedure Evaluation (Signed)
Anesthesia Post Note  Patient: Lindsey Harrington  Procedure(s) Performed: CONIZATION CERVIX WITH BIOPSY (N/A )     Patient location during evaluation: PACU Anesthesia Type: General Level of consciousness: awake and alert Pain management: pain level controlled Vital Signs Assessment: post-procedure vital signs reviewed and stable Respiratory status: spontaneous breathing, nonlabored ventilation, respiratory function stable and patient connected to nasal cannula oxygen Cardiovascular status: blood pressure returned to baseline and stable Postop Assessment: no apparent nausea or vomiting Anesthetic complications: no    Last Vitals:  Vitals:   04/01/18 1615 04/01/18 1700  BP: (!) 141/91 (!) 137/91  Pulse: 78 77  Resp: 10 14  Temp:  36.5 C  SpO2: 99% 100%    Last Pain:  Vitals:   04/01/18 1700  TempSrc: Oral  PainSc: 0-No pain                 Effie Berkshire

## 2018-04-04 ENCOUNTER — Encounter (HOSPITAL_BASED_OUTPATIENT_CLINIC_OR_DEPARTMENT_OTHER): Payer: Self-pay | Admitting: Obstetrics and Gynecology

## 2019-02-26 ENCOUNTER — Other Ambulatory Visit: Payer: Self-pay | Admitting: Cardiology

## 2019-02-26 DIAGNOSIS — Z20822 Contact with and (suspected) exposure to covid-19: Secondary | ICD-10-CM

## 2019-02-27 LAB — NOVEL CORONAVIRUS, NAA: SARS-CoV-2, NAA: NOT DETECTED

## 2019-11-29 ENCOUNTER — Other Ambulatory Visit: Payer: Self-pay

## 2019-11-29 ENCOUNTER — Other Ambulatory Visit: Payer: PRIVATE HEALTH INSURANCE

## 2019-11-29 DIAGNOSIS — Z20822 Contact with and (suspected) exposure to covid-19: Secondary | ICD-10-CM

## 2019-11-30 LAB — SARS-COV-2, NAA 2 DAY TAT

## 2019-11-30 LAB — NOVEL CORONAVIRUS, NAA: SARS-CoV-2, NAA: NOT DETECTED

## 2019-12-22 ENCOUNTER — Other Ambulatory Visit: Payer: Self-pay

## 2019-12-22 ENCOUNTER — Other Ambulatory Visit: Payer: PRIVATE HEALTH INSURANCE

## 2019-12-22 DIAGNOSIS — Z20822 Contact with and (suspected) exposure to covid-19: Secondary | ICD-10-CM

## 2019-12-25 LAB — NOVEL CORONAVIRUS, NAA: SARS-CoV-2, NAA: NOT DETECTED

## 2020-05-07 DIAGNOSIS — U071 COVID-19: Secondary | ICD-10-CM

## 2020-05-07 HISTORY — DX: COVID-19: U07.1

## 2020-07-08 DIAGNOSIS — N939 Abnormal uterine and vaginal bleeding, unspecified: Secondary | ICD-10-CM | POA: Diagnosis not present

## 2020-09-17 DIAGNOSIS — L720 Epidermal cyst: Secondary | ICD-10-CM | POA: Diagnosis not present

## 2020-10-04 HISTORY — PX: CYST EXCISION: SHX5701

## 2020-10-17 DIAGNOSIS — L720 Epidermal cyst: Secondary | ICD-10-CM | POA: Diagnosis not present

## 2020-10-17 DIAGNOSIS — L080 Pyoderma: Secondary | ICD-10-CM | POA: Diagnosis not present

## 2020-10-24 DIAGNOSIS — Z760 Encounter for issue of repeat prescription: Secondary | ICD-10-CM | POA: Diagnosis not present

## 2020-10-24 DIAGNOSIS — R928 Other abnormal and inconclusive findings on diagnostic imaging of breast: Secondary | ICD-10-CM | POA: Diagnosis not present

## 2020-10-24 DIAGNOSIS — N6002 Solitary cyst of left breast: Secondary | ICD-10-CM | POA: Diagnosis not present

## 2020-10-24 DIAGNOSIS — N644 Mastodynia: Secondary | ICD-10-CM | POA: Diagnosis not present

## 2020-10-24 DIAGNOSIS — Z3040 Encounter for surveillance of contraceptives, unspecified: Secondary | ICD-10-CM | POA: Diagnosis not present

## 2020-11-05 DIAGNOSIS — N939 Abnormal uterine and vaginal bleeding, unspecified: Secondary | ICD-10-CM | POA: Diagnosis not present

## 2020-11-14 DIAGNOSIS — L720 Epidermal cyst: Secondary | ICD-10-CM | POA: Diagnosis not present

## 2020-11-14 DIAGNOSIS — L308 Other specified dermatitis: Secondary | ICD-10-CM | POA: Diagnosis not present

## 2020-12-16 DIAGNOSIS — L0889 Other specified local infections of the skin and subcutaneous tissue: Secondary | ICD-10-CM | POA: Diagnosis not present

## 2020-12-16 DIAGNOSIS — L0211 Cutaneous abscess of neck: Secondary | ICD-10-CM | POA: Diagnosis not present

## 2020-12-16 DIAGNOSIS — L720 Epidermal cyst: Secondary | ICD-10-CM | POA: Diagnosis not present

## 2021-01-02 DIAGNOSIS — L723 Sebaceous cyst: Secondary | ICD-10-CM | POA: Diagnosis not present

## 2021-03-07 DIAGNOSIS — R059 Cough, unspecified: Secondary | ICD-10-CM | POA: Diagnosis not present

## 2021-03-07 DIAGNOSIS — Z20822 Contact with and (suspected) exposure to covid-19: Secondary | ICD-10-CM | POA: Diagnosis not present

## 2021-03-07 DIAGNOSIS — J101 Influenza due to other identified influenza virus with other respiratory manifestations: Secondary | ICD-10-CM | POA: Diagnosis not present

## 2021-03-25 DIAGNOSIS — L720 Epidermal cyst: Secondary | ICD-10-CM | POA: Diagnosis not present

## 2021-04-11 ENCOUNTER — Other Ambulatory Visit: Payer: Self-pay

## 2021-04-11 ENCOUNTER — Ambulatory Visit (INDEPENDENT_AMBULATORY_CARE_PROVIDER_SITE_OTHER): Payer: BC Managed Care – PPO | Admitting: Plastic Surgery

## 2021-04-11 VITALS — BP 123/63 | HR 95 | Ht 66.0 in | Wt 233.6 lb

## 2021-04-11 DIAGNOSIS — L905 Scar conditions and fibrosis of skin: Secondary | ICD-10-CM

## 2021-04-13 NOTE — Progress Notes (Signed)
Referring Provider Sydnee Levans, MD [address] [place],  [place] [address]   CC:  Cyst left posterior neck.   Lindsey Harrington is an 46 y.o. female.  HPI: Patient is a 46 year old who had a cystic lesion drained from the left posterior neck by dermatology in July 2022.  This was done by [facility] dermatology.  She has had some wound healing problems.  Currently she notes that the wound is well healed and she is not having any problems currently.  Allergies  Allergen Reactions   Tylox [Oxycodone-Acetaminophen] Other (See Comments)    Sever shaking and dizziness    Outpatient Encounter Medications as of 04/11/2021  Medication Sig Note   Cholecalciferol (VITAMIN D3) 50000 units CAPS Take 50,000 Units by mouth 2 (two) times a week.    ibuprofen (ADVIL,MOTRIN) 800 MG tablet Take 1 tablet (800 mg total) by mouth every 8 (eight) hours as needed for moderate pain.    levonorgestrel (MIRENA) 20 MCG/24HR IUD 1 each by Intrauterine route once. 03/19/2017: Mirena inserted 10/2016   Multiple Vitamin (MULTIVITAMIN) tablet Take 1 tablet by mouth daily.    ondansetron (ZOFRAN) 4 MG tablet Take 1 tablet (4 mg total) by mouth every 8 (eight) hours as needed for nausea or vomiting.    RABEprazole (ACIPHEX) 20 MG tablet Take 1 tablet (20 mg total) by mouth daily.    valACYclovir (VALTREX) 500 MG tablet Take 1 tablet (500 mg total) by mouth 2 (two) times daily. (Patient taking differently: Take 500 mg by mouth daily.)    No facility-administered encounter medications on file as of 04/11/2021.     Past Medical History:  Diagnosis Date   Abnormal Pap smear    Anemia 01/17/01   Anemia    Dysuria 07/05/97   postcoital   First trimester bleeding 04/07/00   GBS (group B streptococcus) infection 2002   GERD (gastroesophageal reflux disease)    H/O candidiasis    H/O cystitis    H/O hemorrhoids 01/17/01   H/O herpes simplex infection   2003    H/O low back pain 05/24/02   H/O varicella     Hemorrhoid    Herpes    Hx: UTI (urinary tract infection) 07/05/97   Irregular menses 06/03/01   Monilial vaginitis 05/24/02   Pelvic pain 03/14/03   Pelvic pain    Trichomonas     Past Surgical History:  Procedure Laterality Date   CERVICAL CONIZATION W/BX N/A 03/19/2017   Procedure: CONIZATION CERVIX WITH BIOPSY;  Surgeon: Ena Dawley, MD;  Location: [facility];  Service: Gynecology;  Laterality: N/A;   CERVICAL CONIZATION W/BX N/A 04/01/2018   Procedure: CONIZATION CERVIX WITH BIOPSY;  Surgeon: Ena Dawley, MD;  Location: [facility];  Service: Gynecology;  Laterality: N/A;   CESAREAN SECTION  2002 & 2006   CESAREAN SECTION     WISDOM TOOTH EXTRACTION  1998   WISDOM TOOTH EXTRACTION      Family History  Problem Relation Age of Onset   Heart disease Father    Hyperlipidemia Father    Hypertension Maternal Grandmother    Stroke Maternal Grandmother    Gout Maternal Grandmother    Arthritis Maternal Grandmother    Cancer Maternal Grandfather    Hypertension Brother    Cancer Paternal Aunt    Cancer Paternal Uncle    Muscular dystrophy Sister     Social History   Social History Narrative   ** Merged History Encounter **  Review of Systems General: Denies fevers, chills, weight loss CV: Denies chest pain, shortness of breath, palpitations   Physical Exam Vitals with BMI 04/11/2021 04/01/2018 04/01/2018  Height 5\' 6"  - -  Weight 233 lbs 10 oz - -  BMI 86.76 - -  Systolic 195 093 267  Diastolic 63 91 91  Pulse 95 77 78    General:  No acute distress,  Alert and oriented, Non-Toxic, Normal speech and affect HEENT: Well-healed wound left posterior neck no evidence of hypertrophic scarring or healing no drainage no erythema.  Assessment/Plan Patient has had a prolonged healing.  From excision of a cyst in the left posterior neck.  Currently things are well-healed.  I explained to her the I would not recommend any treatment the way things  look today but I be happy to see her back if she has additional problems.  [name] 04/13/2021, 7:58 PM

## 2021-04-17 DIAGNOSIS — Z1231 Encounter for screening mammogram for malignant neoplasm of breast: Secondary | ICD-10-CM | POA: Diagnosis not present

## 2021-04-17 DIAGNOSIS — Z1211 Encounter for screening for malignant neoplasm of colon: Secondary | ICD-10-CM | POA: Diagnosis not present

## 2021-04-17 DIAGNOSIS — Z3040 Encounter for surveillance of contraceptives, unspecified: Secondary | ICD-10-CM | POA: Diagnosis not present

## 2021-04-17 DIAGNOSIS — Z01419 Encounter for gynecological examination (general) (routine) without abnormal findings: Secondary | ICD-10-CM | POA: Diagnosis not present

## 2021-04-25 ENCOUNTER — Institutional Professional Consult (permissible substitution): Payer: PRIVATE HEALTH INSURANCE | Admitting: Plastic Surgery

## 2021-05-02 ENCOUNTER — Other Ambulatory Visit: Payer: Self-pay | Admitting: Obstetrics and Gynecology

## 2021-05-02 DIAGNOSIS — R928 Other abnormal and inconclusive findings on diagnostic imaging of breast: Secondary | ICD-10-CM

## 2021-06-27 ENCOUNTER — Ambulatory Visit
Admission: RE | Admit: 2021-06-27 | Discharge: 2021-06-27 | Disposition: A | Discharge status: Home / Self Care | Payer: BC Managed Care – PPO | Source: Ambulatory Visit | Attending: Obstetrics and Gynecology | Admitting: Obstetrics and Gynecology

## 2021-06-27 ENCOUNTER — Other Ambulatory Visit: Payer: Self-pay | Admitting: Obstetrics and Gynecology

## 2021-06-27 DIAGNOSIS — R921 Mammographic calcification found on diagnostic imaging of breast: Secondary | ICD-10-CM

## 2021-06-27 DIAGNOSIS — R922 Inconclusive mammogram: Secondary | ICD-10-CM | POA: Diagnosis not present

## 2021-06-27 DIAGNOSIS — R928 Other abnormal and inconclusive findings on diagnostic imaging of breast: Secondary | ICD-10-CM

## 2021-07-02 DIAGNOSIS — L0211 Cutaneous abscess of neck: Secondary | ICD-10-CM | POA: Diagnosis not present

## 2021-07-02 DIAGNOSIS — L720 Epidermal cyst: Secondary | ICD-10-CM | POA: Diagnosis not present

## 2021-07-23 DIAGNOSIS — Z1211 Encounter for screening for malignant neoplasm of colon: Secondary | ICD-10-CM | POA: Diagnosis not present

## 2021-07-23 DIAGNOSIS — K219 Gastro-esophageal reflux disease without esophagitis: Secondary | ICD-10-CM | POA: Diagnosis not present

## 2021-07-30 DIAGNOSIS — D214 Benign neoplasm of connective and other soft tissue of abdomen: Secondary | ICD-10-CM | POA: Diagnosis not present

## 2021-07-30 DIAGNOSIS — K5730 Diverticulosis of large intestine without perforation or abscess without bleeding: Secondary | ICD-10-CM | POA: Diagnosis not present

## 2021-07-30 DIAGNOSIS — K635 Polyp of colon: Secondary | ICD-10-CM | POA: Diagnosis not present

## 2021-07-30 DIAGNOSIS — Z1211 Encounter for screening for malignant neoplasm of colon: Secondary | ICD-10-CM | POA: Diagnosis not present

## 2021-08-21 DIAGNOSIS — N8000 Endometriosis of the uterus, unspecified: Secondary | ICD-10-CM | POA: Diagnosis not present

## 2021-08-21 DIAGNOSIS — N939 Abnormal uterine and vaginal bleeding, unspecified: Secondary | ICD-10-CM | POA: Diagnosis not present

## 2021-09-18 DIAGNOSIS — L089 Local infection of the skin and subcutaneous tissue, unspecified: Secondary | ICD-10-CM | POA: Diagnosis not present

## 2021-09-18 DIAGNOSIS — L720 Epidermal cyst: Secondary | ICD-10-CM | POA: Diagnosis not present

## 2021-09-18 DIAGNOSIS — L732 Hidradenitis suppurativa: Secondary | ICD-10-CM | POA: Diagnosis not present

## 2021-10-27 NOTE — H&P (Signed)
Lindsey Harrington is a 46 y.o. female, P: 3-0-3-3 who presents for hysterectomy because of abnormal uterine bleeding, adenomyosis and uterine fibroid.  The patient has a long standing history of menorrhagia that  in years past was initially managed with oral contraceptives and then the Mirena IUD.  In 2020 the patient expelled her Mirena IUD and subsequently began continuous vaginal bleeding.  This bleeding was characterized by spotting at times and at other times a heavy flow with clots, gushes of blood and the need for double protection every hour.  Though the patient had cramping, that could be rated 10/10, they would quickly subside with the passage of clots. She was once again given continuous oral contraceptives however, her bleeding continued to be irregular and at times heavy. Since Aug 21, 2021 the patient has been on Myfembree which lessened her bleeding volume (only changes protection every 4 hours) but some form of bleeding occurs  most days each month.  A pelvic ultrasound in 2021 revealed and anteverted heterogenous uterus with features of adenomyosis (volume 162 cc) measuring 10.9 cm from fundus to external os; 10.94 x 4.86 x 5.81 cm, endometrium: 7.23 mm, fibroid: 1.97 cm, right ovary-3.0 cm and left ovary-2.8 cm.  She denies any changes in her bowel or bladder function and attributes her frequent bouts of vaginal itching to her chronic use of minocycline.  The patient was given a review of both  medical and surgical management options for her condition and symptoms,  she has decided however, to proceed with definitive therapy in the form of hysterectomy.   Past Medical History  OB History: G: 6;  P: 3-0-3-3;  C-section: 2002, 2006 and 2011  GYN History: menarche: 46 YO;   LMP: see HPI;    Contraception: Oral Contraceptives;  History of CIN-3 managed with Cold Knife Conization (free margins);  Last PAP smear: 2020-normal with negative HPV  Medical History: GERD, Anemia, Adenomyosis, Herpes  Simplex - 2, Left Breast Calcifications, Hidradenitis Supprativa,  High Risk HPV and Vitamin D Deficiency  Surgical History: 2019 Cold Knife Conization (CIN-3) Denies problems with anesthesia or history of blood transfusions  Family History: Breast Cancer, Cardiovascular Disease, Hyperlipidemia, Muscular Dystrophy, Hypertension, Stroke and Gout  Social History:   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   ROS: Admits to glasses/contact lenses;  Denies headache, vision changes, nasal congestion, dysphagia, tinnitus, dizziness, hoarseness, cough,  chest pain, shortness of breath, nausea, vomiting, diarrhea,constipation,  urinary frequency, urgency  dysuria, hematuria, vaginitis symptoms, pelvic pain, swelling of joints,easy bruising,  myalgias, arthralgias, skin rashes, unexplained weight loss and except as is mentioned in the history of present illness, patient's review of systems is otherwise negative.    Physical Exam Bp: 138/72;  P: 100 regular;  Height: 5'6";  Weight: 235 lbs.;  BMI: 37.9;  O2Sat.: 99% room air;   Neck: supple without masses or thyromegaly Lungs: clear to auscultation Heart: regular rate and rhythm Abdomen: soft, non-tender and no organomegaly Pelvic:EGBUS- wnl; vagina-normal rugae with moderate blood; uterus-appears upper limits of normal size though exam is limited by body habitus, cervix  without lesions or motion tenderness; adnexae-no tenderness or masses Extremities:  no clubbing, cyanosis or edema   Assesment: Abnormal Uterine Bleeding                       Adenomyosis           Uterine Fibroid   Disposition:  The robot-assisted hysterectomy was reviewed with the patient along with the indication for her procedure. Benefits of the robotic approach include lesser postoperative pain, less blood loss during surgery, reduced risk of injury to other organs, due to better visualization with a 3-D HD 10 times magnifying camera; shorter hospital stay between 0-1 night and rapid recovery with return to daily routine in 2-3 weeks (however, nothing in the vagina for 6 weeks). Although the robot-assisted hysterectomy has a longer operative time than traditional laparotomy, the benefits usually outweigh the risks, in a patient with good medical history,   Risks include bleeding, infection, injury to other organs (bladder more vulnerable if previous cesarean delivery) , need for laparotomy, transient post-operative facial edema, increased risk of pelvic prolapse (associated with any hysterectomy) as well as earlier onset of menopause. Preservation of the ovaries was also reviewed and recommended. Finally, we recommended bilateral salpingectomy for lifelong reduction of ovarian cancer. A Miralax Bowel Prep was given to the patient to complete the day before her surgery.  The patient's questions were answered and she verbalized understanding of the risks and pre-operative instructions. The patient has consented to proceed with a Robot Assisted Laparoscopic Hysterectomy with Bilateral Salpingectomy and possible Laparotomy at [facility] on November 06, 2021.     CSN# [id number]   Pecola Haxton J. Florene Glen, PA-C  for Dr.  Dede Query. Rivard

## 2021-10-28 ENCOUNTER — Encounter (HOSPITAL_BASED_OUTPATIENT_CLINIC_OR_DEPARTMENT_OTHER): Payer: Self-pay | Admitting: Obstetrics and Gynecology

## 2021-10-28 ENCOUNTER — Other Ambulatory Visit: Payer: Self-pay

## 2021-10-28 NOTE — Progress Notes (Signed)
Spoke w/ via phone for pre-op interview---De[facility] Lab needs dos----  urine pregnancy per anesthesia, surgeon orders pending as of 10/28/21            Lab results------11/04/21 lab appt for cbc, type & screen COVID test -----patient states asymptomatic no test needed Arrive at -------0845 on Thursday, November 06, 2021   NPO after MN NO Solid Food.  Clear liquids from MN until---0745 Med rec completed Medications to take morning of surgery -----Myfembree, Minocin, Aciphex Diabetic medication -----n/a Patient instructed no nail polish to be worn day of surgery Patient instructed to bring photo id and insurance card day of surgery Patient aware to have Driver (ride ) / caregiver    for 24 hours after surgery - husband, Tyron Patient Special Instructions -----Extended / overnight stay instructions given. Pre-Op special Istructions -----Requested orders via Epic IB from Dr. Cletis Media on 10/28/21. Patient verbalized understanding of instructions that were given at this phone interview. Patient denies shortness of breath, chest pain, fever, cough at this phone interview.

## 2021-10-28 NOTE — Progress Notes (Signed)
Your procedure is scheduled on Thursday, 11/06/2021.  Report to [facility]. M.   Call this number if you have problems the morning of surgery  :[phone].   OUR ADDRESS IS [place].  WE ARE LOCATED IN [facility].  PLEASE BRING YOUR INSURANCE CARD AND PHOTO ID DAY OF SURGERY.  ONLY 2 PEOPLE ARE ALLOWED IN  WAITING  ROOM.                                      REMEMBER:  DO NOT EAT FOOD, CANDY GUM OR MINTS  AFTER MIDNIGHT THE NIGHT BEFORE YOUR SURGERY . YOU MAY HAVE CLEAR LIQUIDS FROM MIDNIGHT THE NIGHT BEFORE YOUR SURGERY UNTIL  7:45 AM. NO CLEAR LIQUIDS AFTER   7:45 AM DAY OF SURGERY.  YOU MAY  BRUSH YOUR TEETH MORNING OF SURGERY AND RINSE YOUR MOUTH OUT, NO CHEWING GUM CANDY OR MINTS.     CLEAR LIQUID DIET   Foods Allowed                                                                     Foods Excluded  Coffee and tea, regular and decaf                             liquids that you cannot  Plain Jell-O                                                                   see through such as: Fruit ices (not with fruit pulp)                                     milk, soups, orange juice  Plain  Popsicles                                    All solid food Carbonated beverages, regular and diet                                    Cranberry, grape and apple juices Sports drinks like Gatorade _____________________________________________________________________     TAKE THESE MEDICATIONS MORNING OF SURGERY: Myfembree, Minocin, & Aciphex   UP TO 4 VISITORS  MAY VISIT IN THE EXTENDED RECOVERY ROOM UNTIL 800 PM ONLY.  ONE  VISITOR AGE 26 AND OVER MAY SPEND THE NIGHT AND MUST BE IN EXTENDED RECOVERY ROOM NO LATER THAN 800 PM . YOUR DISCHARGE TIME AFTER YOU SPEND THE NIGHT IS 900 AM THE MORNING AFTER YOUR SURGERY.  YOU MAY PACK A SMALL OVERNIGHT BAG WITH TOILETRIES FOR YOUR OVERNIGHT  STAY IF YOU WISH.  YOUR PRESCRIPTION  MEDICATIONS WILL BE PROVIDED DURING [facility].                                      DO NOT WEAR JEWERLY, MAKE UP. DO NOT WEAR LOTIONS, POWDERS, PERFUMES OR NAIL POLISH ON YOUR FINGERNAILS. TOENAIL POLISH IS OK TO WEAR. DO NOT SHAVE FOR 48 HOURS PRIOR TO DAY OF SURGERY. MEN MAY SHAVE FACE AND NECK. CONTACTS, GLASSES, OR DENTURES MAY NOT BE WORN TO SURGERY.  REMEMBER: NO SMOKING, DRUGS OR ALCOHOL FOR 24 HOURS BEFORE YOUR SURGERY.                                    [facility] IS NOT RESPONSIBLE  FOR ANY BELONGINGS.                                                                    Marland Kitchen           [facility] - Preparing for Surgery Before surgery, you can play an important role.  Because skin is not sterile, your skin needs to be as free of germs as possible.  You can reduce the number of germs on your skin by washing with CHG (chlorahexidine gluconate) soap before surgery.  CHG is an antiseptic cleaner which kills germs and bonds with the skin to continue killing germs even after washing. Please DO NOT use if you have an allergy to CHG or antibacterial soaps.  If your skin becomes reddened/irritated stop using the CHG and inform your nurse when you arrive at Short Stay. Do not shave (including legs and underarms) for at least 48 hours prior to the first CHG shower.  You may shave your face/neck. Please follow these instructions carefully:  1.  Shower with CHG Soap the night before surgery and the  morning of Surgery.  2.  If you choose to wash your hair, wash your hair first as usual with your  normal  shampoo.  3.  After you shampoo, rinse your hair and body thoroughly to remove the  shampoo.                            4.  Use CHG as you would any other liquid soap.  You can apply chg directly  to the skin and wash , please wash your belly button thoroughly with chg soap provided night before and morning of your surgery.                     Gently with a scrungie or clean washcloth.  5.   Apply the CHG Soap to your body ONLY FROM THE NECK DOWN.   Do not use on face/ open                           Wound or open sores. Avoid contact with eyes, ears mouth and genitals (private parts).  Wash face,  Genitals (private parts) with your normal soap.             6.  Wash thoroughly, paying special attention to the area where your surgery  will be performed.  7.  Thoroughly rinse your body with warm water from the neck down.  8.  DO NOT shower/wash with your normal soap after using and rinsing off  the CHG Soap.                9.  Pat yourself dry with a clean towel.            10.  Wear clean pajamas.            11.  Place clean sheets on your bed the night of your first shower and do not  sleep with pets. Day of Surgery : Do not apply any lotions/deodorants the morning of surgery.  Please wear clean clothes to the hospital/surgery center.  IF YOU HAVE ANY SKIN IRRITATION OR PROBLEMS WITH THE SURGICAL SOAP, PLEASE GET A BAR OF GOLD DIAL SOAP AND SHOWER THE NIGHT BEFORE YOUR SURGERY AND THE MORNING OF YOUR SURGERY. PLEASE LET THE NURSE KNOW MORNING OF YOUR SURGERY IF YOU HAD ANY PROBLEMS WITH THE SURGICAL SOAP.   ________________________________________________________________________                                                        QUESTIONS Holland Falling PRE OP NURSE PHONE [phone].

## 2021-10-29 ENCOUNTER — Other Ambulatory Visit: Payer: Self-pay | Admitting: Obstetrics and Gynecology

## 2021-10-30 DIAGNOSIS — Z79899 Other long term (current) drug therapy: Secondary | ICD-10-CM | POA: Diagnosis not present

## 2021-10-30 DIAGNOSIS — Z792 Long term (current) use of antibiotics: Secondary | ICD-10-CM | POA: Diagnosis not present

## 2021-10-30 DIAGNOSIS — L720 Epidermal cyst: Secondary | ICD-10-CM | POA: Diagnosis not present

## 2021-10-30 DIAGNOSIS — L732 Hidradenitis suppurativa: Secondary | ICD-10-CM | POA: Diagnosis not present

## 2021-10-30 DIAGNOSIS — L089 Local infection of the skin and subcutaneous tissue, unspecified: Secondary | ICD-10-CM | POA: Diagnosis not present

## 2021-10-31 ENCOUNTER — Ambulatory Visit
Admission: RE | Admit: 2021-10-31 | Discharge: 2021-10-31 | Disposition: A | Discharge status: Home / Self Care | Payer: BC Managed Care – PPO | Source: Ambulatory Visit | Attending: Obstetrics and Gynecology | Admitting: Obstetrics and Gynecology

## 2021-10-31 DIAGNOSIS — R921 Mammographic calcification found on diagnostic imaging of breast: Secondary | ICD-10-CM | POA: Diagnosis not present

## 2021-11-04 ENCOUNTER — Encounter (HOSPITAL_COMMUNITY)
Admission: RE | Admit: 2021-11-04 | Discharge: 2021-11-04 | Disposition: A | Discharge status: Home / Self Care | Payer: BC Managed Care – PPO | Source: Ambulatory Visit | Attending: Obstetrics and Gynecology | Admitting: Obstetrics and Gynecology

## 2021-11-04 DIAGNOSIS — E559 Vitamin D deficiency, unspecified: Secondary | ICD-10-CM | POA: Diagnosis not present

## 2021-11-04 DIAGNOSIS — N939 Abnormal uterine and vaginal bleeding, unspecified: Secondary | ICD-10-CM | POA: Diagnosis not present

## 2021-11-04 DIAGNOSIS — Z01812 Encounter for preprocedural laboratory examination: Secondary | ICD-10-CM | POA: Insufficient documentation

## 2021-11-04 DIAGNOSIS — N920 Excessive and frequent menstruation with regular cycle: Secondary | ICD-10-CM | POA: Diagnosis not present

## 2021-11-04 DIAGNOSIS — N8003 Adenomyosis of the uterus: Secondary | ICD-10-CM | POA: Diagnosis not present

## 2021-11-04 DIAGNOSIS — B009 Herpesviral infection, unspecified: Secondary | ICD-10-CM | POA: Diagnosis not present

## 2021-11-04 DIAGNOSIS — R87810 Cervical high risk human papillomavirus (HPV) DNA test positive: Secondary | ICD-10-CM | POA: Diagnosis not present

## 2021-11-04 DIAGNOSIS — D500 Iron deficiency anemia secondary to blood loss (chronic): Secondary | ICD-10-CM | POA: Diagnosis not present

## 2021-11-04 DIAGNOSIS — Z79899 Other long term (current) drug therapy: Secondary | ICD-10-CM | POA: Diagnosis not present

## 2021-11-04 DIAGNOSIS — D630 Anemia in neoplastic disease: Secondary | ICD-10-CM | POA: Diagnosis not present

## 2021-11-04 DIAGNOSIS — D259 Leiomyoma of uterus, unspecified: Secondary | ICD-10-CM | POA: Diagnosis not present

## 2021-11-04 DIAGNOSIS — Z885 Allergy status to narcotic agent status: Secondary | ICD-10-CM | POA: Diagnosis not present

## 2021-11-04 DIAGNOSIS — Z86001 Personal history of in-situ neoplasm of cervix uteri: Secondary | ICD-10-CM | POA: Diagnosis not present

## 2021-11-04 DIAGNOSIS — K219 Gastro-esophageal reflux disease without esophagitis: Secondary | ICD-10-CM | POA: Diagnosis not present

## 2021-11-04 LAB — CBC
HCT: 27.9 % — ABNORMAL LOW (ref 36.0–46.0)
Hemoglobin: 8 g/dL — ABNORMAL LOW (ref 12.0–15.0)
MCH: 20.4 pg — ABNORMAL LOW (ref 26.0–34.0)
MCHC: 28.7 g/dL — ABNORMAL LOW (ref 30.0–36.0)
MCV: 71.2 fL — ABNORMAL LOW (ref 80.0–100.0)
Platelets: 400 10*3/uL (ref 150–400)
RBC: 3.92 MIL/uL (ref 3.87–5.11)
RDW: 17.4 % — ABNORMAL HIGH (ref 11.5–15.5)
WBC: 11.4 10*3/uL — ABNORMAL HIGH (ref 4.0–10.5)
nRBC: 0.6 % — ABNORMAL HIGH (ref 0.0–0.2)

## 2021-11-04 NOTE — Progress Notes (Signed)
Pt given new pre op instructions with ensure  presurgery drink 2 at 1000 pm night before surgery and drink 1 @ 745 am morning of surgery, Pt aware to follow all bowel prep and clear liquid instructions from dr rivard.

## 2021-11-04 NOTE — Progress Notes (Addendum)
Left message with elmira powell cell phone number, patient hemoglobin was 8.0 at pre op labs today.   Addendum: spoke with elmira powell and made aware pt hemoglobin was 8.0 with pre op labs today.  Addendum: spoke with dr c Lanetta Inch, and made aware pt hemaglobin was 8.0 with labs today, pt can be done at [facility] as  800 first case for 8-3--2023 surgery with dr rivard per dr d Lanetta Inch, mda.  Addendum: spoke with elmira powell and made aware pt can be done as 800 am first case for dr rivard for 11-06-2021 surgery.

## 2021-11-04 NOTE — Progress Notes (Signed)
  Spoke with patient by phone, pt aware arrive 600 am 11-06-2021, clear liquids midnight until 500 am, drink 2 ensure presurgery drinks at 1000 pm night before surgery and 1 ensure drink at 500 am morning of surgery.

## 2021-11-04 NOTE — Progress Notes (Signed)
Your procedure is scheduled on 11-06-2021  [facility]   Call this number if you have problems the morning of surgery  :[phone].   OUR ADDRESS IS [place].  WE ARE [facility]ATED [facility].  PLEASE BRING YOUR INSURANCE CARD AND PHOTO ID DAY OF SURGERY.  ONLY 2 PEOPLE ARE ALLOWED IN  WAITING  ROOM.                                      REMEMBER:  DO NOT EAT FOOD, CANDY GUM OR MINTS  AFTER MIDNIGHT THE NIGHT BEFORE YOUR SURGERY . YOU MAY HAVE CLEAR LIQUIDS FROM MIDNIGHT THE NIGHT BEFORE YOUR SURGERY UNTIL  745 AM. NO CLEAR LIQUIDS AFTER   745 AM DAY OF SURGERY.  DRINK 2 ENSURE PRESURGERY DRINKS AT 1000 PM NIGHT BEFORE SURGERY, DRINK 1 ENSURE PRESURGERY DRINKS AT 745 AM MORNING OF SURGERY.  YOU MAY  BRUSH YOUR TEETH MORNING OF SURGERY AND RINSE YOUR MOUTH OUT, NO CHEWING GUM CANDY OR MINTS.     CLEAR LIQUID DIET   Foods Allowed                                                                     Foods Excluded  Coffee and tea, regular and decaf                             liquids that you cannot  Plain Jell-O                                                                   see through such as: Fruit ices (not with fruit pulp)                                     milk, soups, orange juice  Plain  Popsicles                                    All solid food Carbonated beverages, regular and diet                                    Cranberry, grape and apple juices Sports drinks like Gatorade _____________________________________________________________________     TAKE THESE MEDICATIONS MORNING OF SURGERY: MYFENBREE, MONICIN AND ACHIPHEX.    UP TO 4 VISITORS  MAY VISIT IN THE EXTENDED RECOVERY ROOM UNTIL 800 PM ONLY.  ONE  VISITOR AGE 48 AND OVER MAY SPEND THE NIGHT AND MUST BE IN EXTENDED RECOVERY ROOM NO LATER THAN 800 PM . YOUR DISCHARGE TIME AFTER YOU  SPEND THE NIGHT IS 900 AM THE MORNING AFTER YOUR  SURGERY.  YOU MAY PACK A SMALL OVERNIGHT BAG WITH TOILETRIES FOR YOUR OVERNIGHT STAY IF YOU WISH.  YOUR PRESCRIPTION MEDICATIONS WILL BE PROVIDED DURING [facility].                                      DO NOT WEAR JEWERLY, MAKE UP. DO NOT WEAR LOTIONS, POWDERS, PERFUMES OR NAIL POLISH ON YOUR FINGERNAILS. TOENAIL POLISH IS OK TO WEAR. DO NOT SHAVE FOR 48 HOURS PRIOR TO DAY OF SURGERY. MEN MAY SHAVE FACE AND NECK. CONTACTS, GLASSES, OR DENTURES MAY NOT BE WORN TO SURGERY.  REMEMBER: NO SMOKING, DRUGS OR ALCOHOL FOR 24 HOURS BEFORE YOUR SURGERY.                                    [facility] IS NOT RESPONSIBLE  FOR ANY BELONGINGS.                                                                    Marland Kitchen           [facility] - Preparing for Surgery Before surgery, you can play an important role.  Because skin is not sterile, your skin needs to be as free of germs as possible.  You can reduce the number of germs on your skin by washing with CHG (chlorahexidine gluconate) soap before surgery.  CHG is an antiseptic cleaner which kills germs and bonds with the skin to continue killing germs even after washing. Please DO NOT use if you have an allergy to CHG or antibacterial soaps.  If your skin becomes reddened/irritated stop using the CHG and inform your nurse when you arrive at Short Stay. Do not shave (including legs and underarms) for at least 48 hours prior to the first CHG shower.  You may shave your face/neck. Please follow these instructions carefully:  1.  Shower with CHG Soap the night before surgery and the  morning of Surgery.  2.  If you choose to wash your hair, wash your hair first as usual with your  normal  shampoo.  3.  After you shampoo, rinse your hair and body thoroughly to remove the  shampoo.                            4.  Use CHG as you would any other liquid soap.  You can apply chg directly  to the skin and wash , please wash your belly button thoroughly with chg soap  provided night before and morning of your surgery.                     Gently with a scrungie or clean washcloth.  5.  Apply the CHG Soap to your body ONLY FROM THE NECK DOWN.   Do not use on face/ open                           Wound  or open sores. Avoid contact with eyes, ears mouth and genitals (private parts).                       Wash face,  Genitals (private parts) with your normal soap.             6.  Wash thoroughly, paying special attention to the area where your surgery  will be performed.  7.  Thoroughly rinse your body with warm water from the neck down.  8.  DO NOT shower/wash with your normal soap after using and rinsing off  the CHG Soap.                9.  Pat yourself dry with a clean towel.            10.  Wear clean pajamas.            11.  Place clean sheets on your bed the night of your first shower and do not  sleep with pets. Day of Surgery : Do not apply any lotions/deodorants the morning of surgery.  Please wear clean clothes to the hospital/surgery center.  IF YOU HAVE ANY SKIN IRRITATION OR PROBLEMS WITH THE SURGICAL SOAP, PLEASE GET A BAR OF GOLD DIAL SOAP AND SHOWER THE NIGHT BEFORE YOUR SURGERY AND THE MORNING OF YOUR SURGERY. PLEASE LET THE NURSE KNOW MORNING OF YOUR SURGERY IF YOU HAD ANY PROBLEMS WITH THE SURGICAL SOAP.   ________________________________________________________________________                                                        QUESTIONS Holland Falling PRE OP NURSE PHONE [phone].

## 2021-11-06 ENCOUNTER — Ambulatory Visit (HOSPITAL_BASED_OUTPATIENT_CLINIC_OR_DEPARTMENT_OTHER): Payer: BC Managed Care – PPO | Admitting: Anesthesiology

## 2021-11-06 ENCOUNTER — Encounter (HOSPITAL_COMMUNITY)
Admission: AD | Disposition: A | Discharge status: Home / Self Care | Payer: Self-pay | Source: Home / Self Care | Attending: Obstetrics and Gynecology

## 2021-11-06 ENCOUNTER — Encounter (HOSPITAL_BASED_OUTPATIENT_CLINIC_OR_DEPARTMENT_OTHER): Payer: Self-pay | Admitting: Obstetrics and Gynecology

## 2021-11-06 ENCOUNTER — Other Ambulatory Visit: Payer: Self-pay

## 2021-11-06 ENCOUNTER — Inpatient Hospital Stay (HOSPITAL_BASED_OUTPATIENT_CLINIC_OR_DEPARTMENT_OTHER)
Admission: AD | Admit: 2021-11-06 | Discharge: 2021-11-08 | DRG: 743 | Disposition: A | Discharge status: Home / Self Care | Payer: BC Managed Care – PPO | Attending: Obstetrics and Gynecology | Admitting: Obstetrics and Gynecology

## 2021-11-06 DIAGNOSIS — K219 Gastro-esophageal reflux disease without esophagitis: Secondary | ICD-10-CM | POA: Diagnosis present

## 2021-11-06 DIAGNOSIS — Z79899 Other long term (current) drug therapy: Secondary | ICD-10-CM | POA: Diagnosis not present

## 2021-11-06 DIAGNOSIS — D500 Iron deficiency anemia secondary to blood loss (chronic): Secondary | ICD-10-CM | POA: Diagnosis present

## 2021-11-06 DIAGNOSIS — R87810 Cervical high risk human papillomavirus (HPV) DNA test positive: Secondary | ICD-10-CM | POA: Diagnosis present

## 2021-11-06 DIAGNOSIS — B009 Herpesviral infection, unspecified: Secondary | ICD-10-CM | POA: Diagnosis present

## 2021-11-06 DIAGNOSIS — N939 Abnormal uterine and vaginal bleeding, unspecified: Secondary | ICD-10-CM | POA: Diagnosis present

## 2021-11-06 DIAGNOSIS — Z86001 Personal history of in-situ neoplasm of cervix uteri: Secondary | ICD-10-CM

## 2021-11-06 DIAGNOSIS — D630 Anemia in neoplastic disease: Secondary | ICD-10-CM | POA: Diagnosis not present

## 2021-11-06 DIAGNOSIS — D259 Leiomyoma of uterus, unspecified: Secondary | ICD-10-CM | POA: Diagnosis not present

## 2021-11-06 DIAGNOSIS — Z01818 Encounter for other preprocedural examination: Principal | ICD-10-CM

## 2021-11-06 DIAGNOSIS — Z885 Allergy status to narcotic agent status: Secondary | ICD-10-CM | POA: Diagnosis not present

## 2021-11-06 DIAGNOSIS — N8003 Adenomyosis of the uterus: Secondary | ICD-10-CM | POA: Diagnosis present

## 2021-11-06 DIAGNOSIS — E559 Vitamin D deficiency, unspecified: Secondary | ICD-10-CM | POA: Diagnosis present

## 2021-11-06 DIAGNOSIS — N920 Excessive and frequent menstruation with regular cycle: Secondary | ICD-10-CM | POA: Diagnosis not present

## 2021-11-06 HISTORY — PX: ROBOTIC ASSISTED LAPAROSCOPIC HYSTERECTOMY AND SALPINGECTOMY: SHX6379

## 2021-11-06 LAB — POCT I-STAT, CHEM 8
BUN: <3 mg/dL — ABNORMAL LOW (ref 6–20)
Calcium, Ion: 1.13 mmol/L — ABNORMAL LOW (ref 1.15–1.40)
Chloride: 104 mmol/L (ref 98–111)
Creatinine, Ser: 0.7 mg/dL (ref 0.44–1.00)
Glucose, Bld: 164 mg/dL — ABNORMAL HIGH (ref 70–99)
HCT: 28 % — ABNORMAL LOW (ref 36.0–46.0)
Hemoglobin: 9.5 g/dL — ABNORMAL LOW (ref 12.0–15.0)
Potassium: 3.6 mmol/L (ref 3.5–5.1)
Sodium: 140 mmol/L (ref 135–145)
TCO2: 21 mmol/L — ABNORMAL LOW (ref 22–32)

## 2021-11-06 LAB — POCT PREGNANCY, URINE: Preg Test, Ur: NEGATIVE

## 2021-11-06 LAB — ABO/RH: ABO/RH(D): O POS

## 2021-11-06 SURGERY — XI ROBOTIC ASSISTED LAPAROSCOPIC HYSTERECTOMY AND SALPINGECTOMY
Anesthesia: General | Site: Abdomen

## 2021-11-06 MED ORDER — SUGAMMADEX SODIUM 200 MG/2ML IV SOLN
INTRAVENOUS | Status: DC | PRN
  Administered 2021-11-06: 250 mg via INTRAVENOUS

## 2021-11-06 MED ORDER — SCOPOLAMINE 1 MG/3DAYS TD PT72
1.0000 | MEDICATED_PATCH | TRANSDERMAL | Status: DC
  Administered 2021-11-06: 1.5 mg via TRANSDERMAL

## 2021-11-06 MED ORDER — FENTANYL CITRATE (PF) 250 MCG/5ML IJ SOLN
INTRAMUSCULAR | Status: DC | PRN
Start: 2021-11-06 — End: 2021-11-06
  Administered 2021-11-06 (×4): 50 ug via INTRAVENOUS

## 2021-11-06 MED ORDER — STERILE WATER FOR IRRIGATION IR SOLN
Status: DC | PRN
  Administered 2021-11-06: 500 mL

## 2021-11-06 MED ORDER — DIPHENHYDRAMINE HCL 50 MG/ML IJ SOLN: 12.5000 mg | Freq: Four times a day (QID) | INTRAMUSCULAR | Status: DC | PRN

## 2021-11-06 MED ORDER — PHENYLEPHRINE HCL (PRESSORS) 10 MG/ML IV SOLN
INTRAVENOUS | Status: AC
  Filled 2021-11-06: qty 1

## 2021-11-06 MED ORDER — CELECOXIB 200 MG PO CAPS
400.0000 mg | ORAL_CAPSULE | ORAL | Status: AC
  Administered 2021-11-06: 400 mg via ORAL

## 2021-11-06 MED ORDER — PANTOPRAZOLE SODIUM 40 MG PO TBEC
40.0000 mg | DELAYED_RELEASE_TABLET | Freq: Every day | ORAL | Status: DC
  Administered 2021-11-06 – 2021-11-08 (×3): 40 mg via ORAL
  Filled 2021-11-06 (×3): qty 1

## 2021-11-06 MED ORDER — AMISULPRIDE (ANTIEMETIC) 5 MG/2ML IV SOLN: 10.0000 mg | Freq: Once | INTRAVENOUS | Status: DC | PRN

## 2021-11-06 MED ORDER — DIPHENHYDRAMINE HCL 50 MG/ML IJ SOLN
INTRAMUSCULAR | Status: DC | PRN
  Administered 2021-11-06: 12.5 mg via INTRAVENOUS

## 2021-11-06 MED ORDER — PROPOFOL 1000 MG/100ML IV EMUL
INTRAVENOUS | Status: AC
  Filled 2021-11-06: qty 100

## 2021-11-06 MED ORDER — LACTATED RINGERS IV SOLN: INTRAVENOUS | Status: DC

## 2021-11-06 MED ORDER — PROPOFOL 10 MG/ML IV BOLUS
INTRAVENOUS | Status: DC | PRN
  Administered 2021-11-06: 200 mg via INTRAVENOUS

## 2021-11-06 MED ORDER — PHENYLEPHRINE HCL-NACL 20-0.9 MG/250ML-% IV SOLN
INTRAVENOUS | Status: DC | PRN
  Administered 2021-11-06: 25 ug/min via INTRAVENOUS

## 2021-11-06 MED ORDER — HYDROMORPHONE HCL 1 MG/ML IJ SOLN
INTRAMUSCULAR | Status: DC | PRN
  Administered 2021-11-06 (×2): .5 mg via INTRAVENOUS

## 2021-11-06 MED ORDER — PROPOFOL 10 MG/ML IV BOLUS
INTRAVENOUS | Status: AC
  Filled 2021-11-06: qty 20

## 2021-11-06 MED ORDER — SCOPOLAMINE 1 MG/3DAYS TD PT72
MEDICATED_PATCH | TRANSDERMAL | Status: AC
  Filled 2021-11-06: qty 1

## 2021-11-06 MED ORDER — HYDROMORPHONE 1 MG/ML IV SOLN
INTRAVENOUS | Status: DC
  Administered 2021-11-06: 1 mg via INTRAVENOUS
  Administered 2021-11-07: 0.3 mg via INTRAVENOUS
  Filled 2021-11-06: qty 30

## 2021-11-06 MED ORDER — HYDROMORPHONE HCL 1 MG/ML IJ SOLN: 0.2500 mg | INTRAMUSCULAR | Status: DC | PRN

## 2021-11-06 MED ORDER — ACETAMINOPHEN 500 MG PO TABS
ORAL_TABLET | ORAL | Status: AC
  Filled 2021-11-06: qty 2

## 2021-11-06 MED ORDER — GABAPENTIN 300 MG PO CAPS
300.0000 mg | ORAL_CAPSULE | ORAL | Status: AC
  Administered 2021-11-06: 300 mg via ORAL

## 2021-11-06 MED ORDER — ONDANSETRON HCL 4 MG PO TABS: 4.0000 mg | ORAL_TABLET | Freq: Four times a day (QID) | ORAL | Status: DC | PRN

## 2021-11-06 MED ORDER — KETOROLAC TROMETHAMINE 30 MG/ML IJ SOLN
30.0000 mg | Freq: Four times a day (QID) | INTRAMUSCULAR | Status: DC
  Administered 2021-11-06 – 2021-11-07 (×3): 30 mg via INTRAVENOUS
  Filled 2021-11-06 (×3): qty 1

## 2021-11-06 MED ORDER — CELECOXIB 200 MG PO CAPS
ORAL_CAPSULE | ORAL | Status: AC
  Filled 2021-11-06: qty 2

## 2021-11-06 MED ORDER — SODIUM CHLORIDE 0.9 % IV SOLN
2.0000 g | Freq: Once | INTRAVENOUS | Status: AC
  Administered 2021-11-06: 2 g via INTRAVENOUS

## 2021-11-06 MED ORDER — SIMETHICONE 80 MG PO CHEW: 80.0000 mg | CHEWABLE_TABLET | Freq: Four times a day (QID) | ORAL | Status: DC | PRN

## 2021-11-06 MED ORDER — ACETAMINOPHEN 500 MG PO TABS
1000.0000 mg | ORAL_TABLET | Freq: Four times a day (QID) | ORAL | Status: DC
  Administered 2021-11-06 – 2021-11-08 (×7): 1000 mg via ORAL
  Filled 2021-11-06 (×6): qty 2

## 2021-11-06 MED ORDER — ONDANSETRON HCL 4 MG/2ML IJ SOLN: 4.0000 mg | Freq: Four times a day (QID) | INTRAMUSCULAR | Status: DC | PRN

## 2021-11-06 MED ORDER — SODIUM CHLORIDE 0.9 % IV SOLN
INTRAVENOUS | Status: DC | PRN
  Administered 2021-11-06: 120 mL

## 2021-11-06 MED ORDER — SODIUM CHLORIDE 0.9% FLUSH: 9.0000 mL | INTRAVENOUS | Status: DC | PRN

## 2021-11-06 MED ORDER — NALOXONE HCL 0.4 MG/ML IJ SOLN: 0.4000 mg | INTRAMUSCULAR | Status: DC | PRN

## 2021-11-06 MED ORDER — POVIDONE-IODINE 10 % EX SWAB: 2.0000 | Freq: Once | CUTANEOUS | Status: DC

## 2021-11-06 MED ORDER — DIPHENHYDRAMINE HCL 12.5 MG/5ML PO ELIX: 12.5000 mg | ORAL_SOLUTION | Freq: Four times a day (QID) | ORAL | Status: DC | PRN

## 2021-11-06 MED ORDER — LACTATED RINGERS IV SOLN
INTRAVENOUS | Status: DC
  Administered 2021-11-07: 1000 mL via INTRAVENOUS

## 2021-11-06 MED ORDER — PROMETHAZINE HCL 25 MG/ML IJ SOLN: 6.2500 mg | INTRAMUSCULAR | Status: DC | PRN

## 2021-11-06 MED ORDER — DEXAMETHASONE SODIUM PHOSPHATE 10 MG/ML IJ SOLN
INTRAMUSCULAR | Status: DC | PRN
  Administered 2021-11-06: 10 mg via INTRAVENOUS

## 2021-11-06 MED ORDER — PHENAZOPYRIDINE HCL 100 MG PO TABS
200.0000 mg | ORAL_TABLET | Freq: Three times a day (TID) | ORAL | Status: DC
  Administered 2021-11-06 – 2021-11-08 (×7): 200 mg via ORAL
  Filled 2021-11-06 (×7): qty 2

## 2021-11-06 MED ORDER — MIDAZOLAM HCL 2 MG/2ML IJ SOLN
INTRAMUSCULAR | Status: AC
  Filled 2021-11-06: qty 2

## 2021-11-06 MED ORDER — KETOROLAC TROMETHAMINE 30 MG/ML IJ SOLN
INTRAMUSCULAR | Status: DC | PRN
  Administered 2021-11-06: 30 mg via INTRAVENOUS

## 2021-11-06 MED ORDER — LABETALOL HCL 5 MG/ML IV SOLN
INTRAVENOUS | Status: DC | PRN
  Administered 2021-11-06: 5 mg via INTRAVENOUS

## 2021-11-06 MED ORDER — DIPHENHYDRAMINE HCL 50 MG/ML IJ SOLN
INTRAMUSCULAR | Status: AC
  Filled 2021-11-06: qty 1

## 2021-11-06 MED ORDER — ROCURONIUM BROMIDE 10 MG/ML (PF) SYRINGE
PREFILLED_SYRINGE | INTRAVENOUS | Status: DC | PRN
  Administered 2021-11-06: 20 mg via INTRAVENOUS
  Administered 2021-11-06: 30 mg via INTRAVENOUS
  Administered 2021-11-06: 20 mg via INTRAVENOUS
  Administered 2021-11-06: 100 mg via INTRAVENOUS

## 2021-11-06 MED ORDER — MIDAZOLAM HCL 2 MG/2ML IJ SOLN
INTRAMUSCULAR | Status: DC | PRN
  Administered 2021-11-06: 2 mg via INTRAVENOUS

## 2021-11-06 MED ORDER — SODIUM CHLORIDE 0.9 % IR SOLN
Status: DC | PRN
  Administered 2021-11-06: 1000 mL

## 2021-11-06 MED ORDER — ALBUMIN HUMAN 5 % IV SOLN
INTRAVENOUS | Status: AC
  Filled 2021-11-06: qty 500

## 2021-11-06 MED ORDER — MENTHOL 3 MG MT LOZG: 1.0000 | LOZENGE | OROMUCOSAL | Status: DC | PRN

## 2021-11-06 MED ORDER — HYDROMORPHONE HCL 2 MG/ML IJ SOLN
INTRAMUSCULAR | Status: AC
  Filled 2021-11-06: qty 1

## 2021-11-06 MED ORDER — LACTATED RINGERS IV SOLN: INTRAVENOUS | Status: DC | PRN

## 2021-11-06 MED ORDER — CEFAZOLIN SODIUM 1 G IJ SOLR
INTRAMUSCULAR | Status: AC
  Filled 2021-11-06: qty 20

## 2021-11-06 MED ORDER — ACETAMINOPHEN 500 MG PO TABS
1000.0000 mg | ORAL_TABLET | ORAL | Status: AC
  Administered 2021-11-06: 1000 mg via ORAL

## 2021-11-06 MED ORDER — SODIUM CHLORIDE 0.9 % IV SOLN
INTRAVENOUS | Status: AC
  Filled 2021-11-06: qty 2

## 2021-11-06 MED ORDER — MEPERIDINE HCL 25 MG/ML IJ SOLN: 6.2500 mg | INTRAMUSCULAR | Status: DC | PRN

## 2021-11-06 MED ORDER — FENTANYL CITRATE (PF) 250 MCG/5ML IJ SOLN
INTRAMUSCULAR | Status: AC
  Filled 2021-11-06: qty 5

## 2021-11-06 MED ORDER — GABAPENTIN 300 MG PO CAPS
ORAL_CAPSULE | ORAL | Status: AC
  Filled 2021-11-06: qty 1

## 2021-11-06 MED ORDER — LIDOCAINE 2% (20 MG/ML) 5 ML SYRINGE
INTRAMUSCULAR | Status: DC | PRN
  Administered 2021-11-06: 100 mg via INTRAVENOUS

## 2021-11-06 MED ORDER — ONDANSETRON HCL 4 MG/2ML IJ SOLN
INTRAMUSCULAR | Status: DC | PRN
  Administered 2021-11-06: 4 mg via INTRAVENOUS

## 2021-11-06 MED ORDER — PROPOFOL 500 MG/50ML IV EMUL
INTRAVENOUS | Status: DC | PRN
  Administered 2021-11-06: 25 ug/kg/min via INTRAVENOUS

## 2021-11-06 MED ORDER — ENSURE PRE-SURGERY PO LIQD: 592.0000 mL | Freq: Once | ORAL | Status: DC

## 2021-11-06 MED ORDER — SODIUM CHLORIDE 0.9 % IR SOLN
Status: DC | PRN
  Administered 2021-11-06: 200 mL

## 2021-11-06 MED ORDER — ENSURE PRE-SURGERY PO LIQD: 296.0000 mL | Freq: Once | ORAL | Status: DC

## 2021-11-06 MED ORDER — TRAMADOL HCL 50 MG PO TABS
50.0000 mg | ORAL_TABLET | Freq: Four times a day (QID) | ORAL | Status: DC | PRN
  Administered 2021-11-06 – 2021-11-08 (×4): 50 mg via ORAL
  Filled 2021-11-06 (×5): qty 1

## 2021-11-06 MED ORDER — DOCUSATE SODIUM 100 MG PO CAPS
100.0000 mg | ORAL_CAPSULE | Freq: Two times a day (BID) | ORAL | Status: DC
  Administered 2021-11-06 – 2021-11-08 (×4): 100 mg via ORAL
  Filled 2021-11-06 (×4): qty 1

## 2021-11-06 MED ORDER — SODIUM CHLORIDE 0.9 % IV SOLN
2.0000 g | Freq: Once | INTRAVENOUS | Status: AC
  Administered 2021-11-06: 2 g via INTRAVENOUS
  Filled 2021-11-06: qty 2

## 2021-11-06 MED ORDER — ALBUMIN HUMAN 5 % IV SOLN: INTRAVENOUS | Status: DC | PRN

## 2021-11-06 SURGICAL SUPPLY — 105 items
ADH SKN CLS APL DERMABOND .7 (GAUZE/BANDAGES/DRESSINGS) ×1
APL SKNCLS STERI-STRIP NONHPOA (GAUZE/BANDAGES/DRESSINGS) ×1
APL SWBSTK 6 STRL LF DISP (MISCELLANEOUS) ×1
APPLICATOR COTTON TIP 6 STRL (MISCELLANEOUS) IMPLANT
APPLICATOR COTTON TIP 6IN STRL (MISCELLANEOUS) ×2
BAG DECANTER FOR FLEXI CONT (MISCELLANEOUS) ×1 IMPLANT
BARRIER ADHS 3X4 INTERCEED (GAUZE/BANDAGES/DRESSINGS) ×3 IMPLANT
BENZOIN TINCTURE PRP APPL 2/3 (GAUZE/BANDAGES/DRESSINGS) ×1 IMPLANT
BLADE SURG 10 STRL SS (BLADE) ×1 IMPLANT
BRR ADH 4X3 ABS CNTRL BYND (GAUZE/BANDAGES/DRESSINGS) ×1
CATH FOLEY 3WAY  5CC 16FR (CATHETERS) ×2
CATH FOLEY 3WAY 5CC 16FR (CATHETERS) ×2 IMPLANT
CNTNR URN SCR LID CUP LEK RST (MISCELLANEOUS) IMPLANT
CONT SPEC 4OZ STRL OR WHT (MISCELLANEOUS) ×2
COUNTER NEEDLE 1200 MAGNETIC (NEEDLE) ×1 IMPLANT
COVER BACK TABLE 60X90IN (DRAPES) ×3 IMPLANT
COVER SURGICAL LIGHT HANDLE (MISCELLANEOUS) ×1 IMPLANT
COVER TIP SHEARS 8 DVNC (MISCELLANEOUS) ×2 IMPLANT
COVER TIP SHEARS 8MM DA VINCI (MISCELLANEOUS) ×2
DECANTER SPIKE VIAL GLASS SM (MISCELLANEOUS) ×5 IMPLANT
DEFOGGER SCOPE WARMER CLEARIFY (MISCELLANEOUS) ×3 IMPLANT
DERMABOND ADVANCED (GAUZE/BANDAGES/DRESSINGS) ×1
DERMABOND ADVANCED .7 DNX12 (GAUZE/BANDAGES/DRESSINGS) ×2 IMPLANT
DILATOR CANAL MILEX (MISCELLANEOUS) IMPLANT
DRAPE ARM DVNC X/XI (DISPOSABLE) ×8 IMPLANT
DRAPE COLUMN DVNC XI (DISPOSABLE) ×2 IMPLANT
DRAPE DA VINCI XI ARM (DISPOSABLE) ×8
DRAPE DA VINCI XI COLUMN (DISPOSABLE) ×2
DRAPE UTILITY XL STRL (DRAPES) ×3 IMPLANT
DRSG OPSITE POSTOP 4X10 (GAUZE/BANDAGES/DRESSINGS) ×1 IMPLANT
DRSG PAD ABDOMINAL 8X10 ST (GAUZE/BANDAGES/DRESSINGS) ×1 IMPLANT
DURAPREP 26ML APPLICATOR (WOUND CARE) ×3 IMPLANT
ELECT BLADE TIP CTD 4 INCH (ELECTRODE) ×1 IMPLANT
ELECT REM PT RETURN 9FT ADLT (ELECTROSURGICAL) ×2
ELECTRODE REM PT RTRN 9FT ADLT (ELECTROSURGICAL) ×2 IMPLANT
GAUZE 4X4 16PLY ~~LOC~~+RFID DBL (SPONGE) ×6 IMPLANT
GAUZE SPONGE 4X4 12PLY STRL (GAUZE/BANDAGES/DRESSINGS) ×1 IMPLANT
GLOVE BIO SURGEON STRL SZ7 (GLOVE) ×3 IMPLANT
GLOVE BIOGEL PI IND STRL 7.0 (GLOVE) ×10 IMPLANT
GLOVE BIOGEL PI INDICATOR 7.0 (GLOVE) ×7
GLOVE ECLIPSE 6.5 STRL STRAW (GLOVE) ×11 IMPLANT
HOLDER FOLEY CATH W/STRAP (MISCELLANEOUS) IMPLANT
IRRIG SUCT STRYKERFLOW 2 WTIP (MISCELLANEOUS) ×2
IRRIGATION SUCT STRKRFLW 2 WTP (MISCELLANEOUS) ×2 IMPLANT
IV NS 1000ML (IV SOLUTION) ×4
IV NS 1000ML BAXH (IV SOLUTION) IMPLANT
KIT TURNOVER CYSTO (KITS) ×3 IMPLANT
LEGGING LITHOTOMY PAIR STRL (DRAPES) ×3 IMPLANT
NDL SAFETY ECLIPSE 18X1.5 (NEEDLE) IMPLANT
NDL SPNL 22GX7 QUINCKE BK (NEEDLE) IMPLANT
NEEDLE HYPO 18GX1.5 SHARP (NEEDLE) ×2
NEEDLE HYPO 22GX1.5 SAFETY (NEEDLE) ×3 IMPLANT
NEEDLE SPNL 22GX7 QUINCKE BK (NEEDLE) ×2 IMPLANT
NS IRRIG 1000ML POUR BTL (IV SOLUTION) ×3 IMPLANT
OBTURATOR OPTICAL STANDARD 8MM (TROCAR) ×2
OBTURATOR OPTICAL STND 8 DVNC (TROCAR) ×1
OBTURATOR OPTICALSTD 8 DVNC (TROCAR) ×2 IMPLANT
OCCLUDER COLPOPNEUMO (BALLOONS) ×3 IMPLANT
PACK ROBOT WH (CUSTOM PROCEDURE TRAY) ×3 IMPLANT
PACK ROBOTIC GOWN (GOWN DISPOSABLE) ×3 IMPLANT
PACK TRENDGUARD 450 HYBRID PRO (MISCELLANEOUS) ×2 IMPLANT
PAD OB MATERNITY 4.3X12.25 (PERSONAL CARE ITEMS) ×3 IMPLANT
PAD PREP 24X48 CUFFED NSTRL (MISCELLANEOUS) ×3 IMPLANT
POUCH LAPAROSCOPIC INSTRUMENT (MISCELLANEOUS) ×3 IMPLANT
PROTECTOR NERVE ULNAR (MISCELLANEOUS) ×9 IMPLANT
RETAINER VISCERAL (MISCELLANEOUS) ×1 IMPLANT
RTRCTR C-SECT PINK 34CM XLRG (MISCELLANEOUS) ×1 IMPLANT
SEAL CANN UNIV 5-8 DVNC XI (MISCELLANEOUS) ×8 IMPLANT
SEAL XI 5MM-8MM UNIVERSAL (MISCELLANEOUS) ×8
SEALER VESSEL DA VINCI XI (MISCELLANEOUS) ×2
SEALER VESSEL EXT DVNC XI (MISCELLANEOUS) IMPLANT
SET IRRIG Y TYPE TUR BLADDER L (SET/KITS/TRAYS/PACK) ×1 IMPLANT
SET TRI-LUMEN FLTR TB AIRSEAL (TUBING) ×3 IMPLANT
SOL SCRUB PVP POV-IOD 4OZ 7.5% (MISCELLANEOUS) ×2
SOLUTION SCRB POV-IOD 4OZ 7.5% (MISCELLANEOUS) ×2 IMPLANT
SPONGE T-LAP 18X18 ~~LOC~~+RFID (SPONGE) ×6 IMPLANT
SPONGE T-LAP 4X18 ~~LOC~~+RFID (SPONGE) ×3 IMPLANT
STRIP CLOSURE SKIN 1/2X4 (GAUZE/BANDAGES/DRESSINGS) ×1 IMPLANT
STRIP CLOSURE SKIN 1/4X4 (GAUZE/BANDAGES/DRESSINGS) ×3 IMPLANT
SUT MNCRL AB 3-0 PS2 27 (SUTURE) ×7 IMPLANT
SUT PLAIN 2 0 XLH (SUTURE) ×2 IMPLANT
SUT VIC AB 0 CT1 18XCR BRD8 (SUTURE) IMPLANT
SUT VIC AB 0 CT1 27 (SUTURE) ×6
SUT VIC AB 0 CT1 27XBRD ANBCTR (SUTURE) ×4 IMPLANT
SUT VIC AB 0 CT1 8-18 (SUTURE) ×8
SUT VIC AB 1 CT1 27 (SUTURE) ×4
SUT VIC AB 1 CT1 27XBRD ANBCTR (SUTURE) IMPLANT
SUT VIC AB 2-0 UR6 27 (SUTURE) ×6 IMPLANT
SUT VICRYL 0 TIES 12 18 (SUTURE) ×1 IMPLANT
SUT VICRYL 0 UR6 27IN ABS (SUTURE) ×3 IMPLANT
SUT VLOC 180 0 6IN GS21 (SUTURE) ×3 IMPLANT
SUT VLOC 180 0 9IN  GS21 (SUTURE) ×4
SUT VLOC 180 0 9IN GS21 (SUTURE) ×4 IMPLANT
SYR BULB IRRIG 60ML STRL (SYRINGE) ×1 IMPLANT
SYR CONTROL 10ML LL (SYRINGE) ×1 IMPLANT
TIP RUMI ORANGE 6.7MMX12CM (TIP) IMPLANT
TIP UTERINE 5.1X6CM LAV DISP (MISCELLANEOUS) IMPLANT
TIP UTERINE 6.7X10CM GRN DISP (MISCELLANEOUS) ×1 IMPLANT
TIP UTERINE 6.7X6CM WHT DISP (MISCELLANEOUS) IMPLANT
TIP UTERINE 6.7X8CM BLUE DISP (MISCELLANEOUS) IMPLANT
TOWEL OR 17X26 10 PK STRL BLUE (TOWEL DISPOSABLE) ×3 IMPLANT
TRENDGUARD 450 HYBRID PRO PACK (MISCELLANEOUS) ×2
TROCAR PORT AIRSEAL 8X120 (TROCAR) ×3 IMPLANT
WATER STERILE IRR 1000ML POUR (IV SOLUTION) ×3 IMPLANT
YANKAUER SUCT BULB TIP NO VENT (SUCTIONS) ×1 IMPLANT

## 2021-11-06 NOTE — Anesthesia Procedure Notes (Signed)
Procedure Name: Intubation Date/Time: 11/06/2021 8:30 AM  Performed by: Clearnce Sorrel, CRNAPre-anesthesia Checklist: Patient identified, Emergency Drugs available, Suction available and Patient being monitored Patient Re-evaluated:Patient Re-evaluated prior to induction Oxygen Delivery Method: Circle System Utilized Preoxygenation: Pre-oxygenation with 100% oxygen Induction Type: IV induction Ventilation: Mask ventilation without difficulty, Oral airway inserted - appropriate to patient size and Two handed mask ventilation required Laryngoscope Size: Mac and 4 Grade View: Grade III Tube type: Oral Tube size: 7.0 mm Number of attempts: 1 Airway Equipment and Method: Stylet and Oral airway Placement Confirmation: positive ETCO2 and breath sounds checked- equal and bilateral Secured at: 22 cm Tube secured with: Tape Dental Injury: Teeth and Oropharynx as per pre-operative assessment

## 2021-11-06 NOTE — Progress Notes (Addendum)
Day of Surgery Procedure(s) (LRB): ATTEMPTED XI ROBOTIC ASSISTED LAPAROSCOPIC HYSTERECTOMY/ABDOMINAL HYSTERECTOMY (N/A)  Subjective: Patient reports that pain is not well managed. Just received Toradol 30 mg IV. Has PRN Dilaudid. Tolerating H2O without difficulty. No nausea / vomiting.  Clear urine. Very bothered by Foley Catheter.  Objective: BP (!) 159/84 (BP Location: Left Arm)   Pulse (!) 102   Temp 98.1 F (36.7 C) (Oral)   Resp 18   Ht '5\' 6"'$  (1.676 m)   Wt 105.4 kg   LMP 10/27/2021 Comment: Patient states that she has been bleeding for a year as of 10/28/21.  SpO2 100%   BMI 37.51 kg/m    Assessment: s/p Procedure(s): ATTEMPTED XI ROBOTIC ASSISTED LAPAROSCOPIC HYSTERECTOMY/ABDOMINAL HYSTERECTOMY  Plan: Advance diet Encourage ambulation Op findings reviewed. Questions answered.  LOS: 0 days    Dede Query Callie Facey 11/06/2021, 5:52 PM    Dilaudid PCA ordered

## 2021-11-06 NOTE — Anesthesia Postprocedure Evaluation (Signed)
Anesthesia Post Note  Patient: Lindsey Harrington  Procedure(s) Performed: ATTEMPTED XI ROBOTIC ASSISTED LAPAROSCOPIC HYSTERECTOMY/ABDOMINAL HYSTERECTOMY (Abdomen)     Patient location during evaluation: PACU Anesthesia Type: General Level of consciousness: awake and alert Pain management: pain level controlled Vital Signs Assessment: post-procedure vital signs reviewed and stable Respiratory status: spontaneous breathing, nonlabored ventilation, respiratory function stable and patient connected to nasal cannula oxygen Cardiovascular status: blood pressure returned to baseline and stable Postop Assessment: no apparent nausea or vomiting Anesthetic complications: no   No notable events documented.  Last Vitals:  Vitals:   11/06/21 1507 11/06/21 1542  BP:  (!) 154/73  Pulse: 94 91  Resp: 11 18  Temp:  36.5 C  SpO2: 100% 100%    Last Pain:  Vitals:   11/06/21 1615  TempSrc:   PainSc: 6                  Myrical Andujo L Tanairy Payeur

## 2021-11-06 NOTE — Interval H&P Note (Signed)
History and Physical Interval Note:  11/06/2021 7:49 AM  Lindsey Harrington  has presented today for surgery, with the diagnosis of abnormal uterine bleeding, uterine adenomysis.  The various methods of treatment have been discussed with the patient and family. After consideration of risks, benefits and other options for treatment, the patient has consented to  Procedure(s): XI ROBOTIC [place] (N/A) as a surgical intervention.  The patient's history has been reviewed, patient examined, no change in status, stable for surgery.  I have reviewed the patient's chart and labs.  Questions were answered to the patient's satisfaction.     Katharine Look A Dory Demont

## 2021-11-06 NOTE — Transfer of Care (Signed)
Immediate Anesthesia Transfer of Care Note  Patient: Lindsey Harrington  Procedure(s) Performed: ATTEMPTED XI ROBOTIC ASSISTED LAPAROSCOPIC HYSTERECTOMY/ABDOMINAL HYSTERECTOMY (Abdomen)  Patient Location: PACU  Anesthesia Type:General  Level of Consciousness: drowsy and patient cooperative  Airway & Oxygen Therapy: Patient Spontanous Breathing  Post-op Assessment: Report given to RN and Post -op Vital signs reviewed and stable  Post vital signs: Reviewed and stable  Last Vitals:  Vitals Value Taken Time  BP 146/69 11/06/21 1408  Temp    Pulse 91 11/06/21 1414  Resp 17 11/06/21 1414  SpO2 98 % 11/06/21 1414  Vitals shown include unvalidated device data.  Last Pain:  Vitals:   11/06/21 0637  TempSrc: Oral  PainSc: 0-No pain      Patients Stated Pain Goal: 5 (75/10/25 8527)  Complications: No notable events documented.

## 2021-11-06 NOTE — Op Note (Signed)
Preoperative diagnosis: menorrhagia with anemia  Postoperative diagnosis: uterine fibroids with suspected adenomyoma  Anesthesia: General  Anesthesiologist: Dr. Sabra Heck  Procedure: Robotically assisted total hysterectomy with bilateral salpingectomy converted to total abdominal  hysterectomy with bilateral salpyngectomy and cystoscopy  Surgeon: Dr. Katharine Look Diannah Rindfleisch  Assistant: Earnstine Regal P.A.-C.  Estimated blood loss: 500  Procedure:  After being informed of the planned procedure with possible complications including but not limited to bleeding, infection, injury to other organs, need for laparotomy, expected hospital stay and recovery, informed consent is obtained and patient is taken to or #5. She is placed in  lithotomy position on [place] with both arms padded and tucked on each side and bilateral knee-high sequential compressive devices. She is given general anesthesia with endotracheal intubation without any complication. She is prepped and draped in a sterile fashion. A three-way Foley catheter is inserted in her bladder.  Pelvic exam reveals: bulky 12 week-size uterus, mobile  A weighted speculum is inserted in the vagina and the anterior lip of the cervix is grasped with a tenaculum forcep. We proceed with a paracervical block and vaginal infiltration using ropivacaine 0.5% diluted 1 in 1 with saline. The uterus was then sounded at 11 cm. We easily dilate the cervix using Hegar dilator to  #27 which allows for easy placement of the intrauterine RUMI manipulator with a 3.0 KOH ring and a vaginal occluder. The ring is sutured to the cervix with 0 Vicryl.  Trocar placement is decided. We infiltrate at the umbilicus with 10 cc of ropivacaine per protocol and perform a 10 mm vertical incision which is brought down bluntly to the fascia. The fascia is identified and grasped with Coker forceps. The fascia is incised with Mayo scissors. Peritoneum is entered bluntly. A pursestring suture of  0 Vicryl is placed on the fascia and a 10 mm Hassan trocar is easily inserted in the abdominal cavity held in placed with a Purstring suture. This allows for easy insufflation of a pneumoperitoneum using warmed CO2 at a maximum pressure of 15 mm of mercury. 60 cc of Ropivacaine 0.5 % diluted 1 in 1 is sent in the pelvis and the patient is positioned in reverse Trendelenburg. We then placed two 52m robotic trocar on the left, one 871mrobotic trocar on the right and one 8 mm patient's side assistant trocar on the right  after infiltrating every site  with ropivacaine per protocol. The robot is docked on the right of the patient after positioning her in Trendelenburg. A Vessel Seal is inserted in arm #4, a Long bipolar forceps is inserted in arm #2 and a Prograsp is inserted in arm #1.  Preparation and docking is completed in 60 minutes.  Observation: the uterus is enlarged with a 10 cm fundal fibroid/adenofibroma, both tubes are normal, ovaries are normal, liver is normal. Appendix is not seen. We are unable to move the uterus up and out of the pelvis. Anterior cul-de-sac shows significant adhesions with the bladder. Posterior cul-de-sac is not visible.  We proceed with bilateral salpyngectomy using Vessel Seal. The tubes are removed form the abdomen with the patient-side assistant trocar.  We start on the right side by sealing and cutting the mesosalpynx , the right utero-ovarian ligament and the right round ligament . We encounter bleeding at the uterine level of the mesosalpynx. Tissues are friable and tear with the weight of the uterus. Using double bipolar cauterization, we are able to reduce bleeding but unable to achieve hemostasis. We placed 2 running sutures  of 0 V-lock without success. Bleeding continues. In light of 400 cc of blood loss, inability to control active bleeding and greatly reduced uterine mobility, decision is made to convert to open laparotomy. All trochars are removed. The fascia of  the umbilical incision is closed with the previously placec 0 Vicryl suture.   Console time: 1 hours and 7 minutes.  We perform a Pfannenstiel incision which is brought down sharply to the fascia. The fascia is incised in a low transverse fashion. Linea alba is dissected and peritoneum is entered bluntly. Self-retaining retractors are positioned easily and bowels are retracted with abdominal packings.  The uterus is pulled out of the abdomen.  Bleeding has stopped. The left round ligament is then sutured with a transfix suture of 0 Vicryl and section with cautery. This gives [place] entry in the retroperitoneum. The left utero-ovarian ligament is isolated, cut and sutured with a transfixed suture of 0 Vicryl. The anterior broad ligament is entered sharply all the way across the lower uterine segment allowing [place] to sharply and bluntly dissect bladder below the cervix. For safety, the bladder is filled with 200 cc of saline, helping to clearly identify the plane of dissection.   Vascular pedicles are then skeletonized and isolated on Rogers forceps. The vascular pedicle are then sectioned and sutured with a transfix suture of 0 Vicryl. Cardinal ligaments are then isolated on Rogers clamps, sectioned and sutured with a transfix suture of 0 Vicryl. Confirming bladder is dissected below the cervix we can now isolate the uterosacral ligament on each side using Rogers clamp, sectioned at and suture it with a transfix suture of 0 Vicryl. Vaginal angles were then isolated on both sides with Rogers clamp. This allows [place] to completely remove the uterus with its cervix after a complete colpotomy.  Vaginal angles were sutured with a transfix suture of 0 Vicryl tied to the corresponding uterosacral ligament for suspension. The vaginal cuff is then closed with figure-of-eight stitches of 0 Vicryl.  We irrigated profusely with warm saline. Hemostasis is then confirmed adequate. Both ureters are visualized, normal in size  with normal peristaltic activity.  All sponges and retractors are removed. Under fascia hemostasis is completed with cauterization. The fascia is closed with 2 running sutures of 0 Vicryl meeting midline. The wound is irrigated with warm saline and hemostasis is completed with cauterization. The The skin is closed with a subcuticular suture of 3-0 Monocryl and Steri-Strips.  All laparoscopic incisions are then closed with subcuticular suture of 3-0 Monocryl and Dermabond.  Instruments and sponge count is complete 2.   Estimated blood loss is 500 cc.  We set up and proceed with cystoscopy confirming intact bladder and bilateral ureteral jets. A foley is reinserted.   The procedure is well tolerated by the patient is taken to recovery room in a well and stable condition.  Dr Cletis Media was present and scrubbed at all times. Surgical assistance was required due to the complexity of the anatomy and procedure.   Specimen: Uterus, cervix, tubes weighing 851 g sent to pathology

## 2021-11-06 NOTE — Discharge Instructions (Addendum)
Call [facility] at [phone] for :  Post operative appointment time and date if it is not included in your post operative information.  Temperature greater than or equal to 100.4 degrees Farenheit orally Excessive pain not managed with your pain medications Excessive bleeding, problems urinating or other concerns   While taking pain medications, take Colace (Docusate Sodium) 100 mg 2-3 times daily until bowel movements are regular to prevent constipation.  Take Ibuprofen 600 mg with food every 6 hours along with Acetaminophen 500 mg (#05-998 mg) for 5 days then as needed for pain Take your iron supplement every other day for at least the next 6 months.  Absorption is improved with Vitamin C intake  You may shower but do not removed your honeycomb dressing until Wednesday, November 12, 2021. You may walk up stairs  You may drive after 2 weeks You may resume a regular diet  You should not lift over 15 pounds for 6 weeks You should not place anything in your vagina for 6 weeks (or until after your post operative visit)

## 2021-11-06 NOTE — Anesthesia Preprocedure Evaluation (Signed)
Anesthesia Evaluation  Patient identified by MRN, date of birth, ID band Patient awake    Reviewed: Allergy & Precautions, NPO status , Patient's Chart, lab work & pertinent test results  Airway Mallampati: III  TM Distance: >3 FB Neck ROM: Full    Dental  (+) Teeth Intact, Dental Advisory Given   Pulmonary neg pulmonary ROS,    Pulmonary exam normal        Cardiovascular  Rhythm:Regular Rate:Normal     Neuro/Psych negative neurological ROS  negative psych ROS   GI/Hepatic Neg liver ROS, GERD  ,  Endo/Other  negative endocrine ROS  Renal/GU negative Renal ROS     Musculoskeletal negative musculoskeletal ROS (+)   Abdominal (+) + obese,   Peds  Hematology  (+) Blood dyscrasia, anemia ,   Anesthesia Other Findings   Reproductive/Obstetrics                             Anesthesia Physical  Anesthesia Plan  ASA: II  Anesthesia Plan: General   Post-op Pain Management: Celebrex PO (pre-op)*, Dilaudid IV, Gabapentin PO (pre-op)* and Tylenol PO (pre-op)*   Induction: Intravenous  PONV Risk Score and Plan: 3 and Ondansetron, Dexamethasone, Midazolam and Treatment may vary due to age or medical condition  Airway Management Planned: Oral ETT  Additional Equipment: None  Intra-op Plan:   Post-operative Plan: Extubation in OR  Informed Consent: I have reviewed the patients History and Physical, chart, labs and discussed the procedure including the risks, benefits and alternatives for the proposed anesthesia with the patient or authorized representative who has indicated his/her understanding and acceptance.     Dental advisory given  Plan Discussed with: CRNA  Anesthesia Plan Comments:         Anesthesia Quick Evaluation

## 2021-11-07 ENCOUNTER — Encounter (HOSPITAL_BASED_OUTPATIENT_CLINIC_OR_DEPARTMENT_OTHER): Payer: Self-pay | Admitting: Obstetrics and Gynecology

## 2021-11-07 ENCOUNTER — Other Ambulatory Visit: Payer: Self-pay

## 2021-11-07 LAB — CBC
HCT: 20 % — ABNORMAL LOW (ref 36.0–46.0)
Hemoglobin: 5.7 g/dL — CL (ref 12.0–15.0)
MCH: 20.7 pg — ABNORMAL LOW (ref 26.0–34.0)
MCHC: 28.5 g/dL — ABNORMAL LOW (ref 30.0–36.0)
MCV: 72.5 fL — ABNORMAL LOW (ref 80.0–100.0)
Platelets: 308 10*3/uL (ref 150–400)
RBC: 2.76 MIL/uL — ABNORMAL LOW (ref 3.87–5.11)
RDW: 17.4 % — ABNORMAL HIGH (ref 11.5–15.5)
WBC: 19.6 10*3/uL — ABNORMAL HIGH (ref 4.0–10.5)
nRBC: 0.5 % — ABNORMAL HIGH (ref 0.0–0.2)

## 2021-11-07 LAB — SURGICAL PATHOLOGY

## 2021-11-07 LAB — HEMOGLOBIN AND HEMATOCRIT, BLOOD
HCT: 26.1 % — ABNORMAL LOW (ref 36.0–46.0)
Hemoglobin: 7.6 g/dL — ABNORMAL LOW (ref 12.0–15.0)

## 2021-11-07 LAB — HEMOGLOBIN A1C
Hgb A1c MFr Bld: 5.9 % — ABNORMAL HIGH (ref 4.8–5.6)
Mean Plasma Glucose: 122.63 mg/dL

## 2021-11-07 LAB — PREPARE RBC (CROSSMATCH)

## 2021-11-07 MED ORDER — ONDANSETRON HCL 4 MG PO TABS: 4.0000 mg | ORAL_TABLET | Freq: Three times a day (TID) | ORAL | Status: DC | PRN

## 2021-11-07 MED ORDER — ACETAMINOPHEN 325 MG PO TABS
650.0000 mg | ORAL_TABLET | Freq: Once | ORAL | Status: AC
  Administered 2021-11-07: 650 mg via ORAL
  Filled 2021-11-07: qty 2

## 2021-11-07 MED ORDER — DIPHENHYDRAMINE HCL 25 MG PO CAPS
25.0000 mg | ORAL_CAPSULE | Freq: Once | ORAL | Status: AC
  Administered 2021-11-07: 25 mg via ORAL
  Filled 2021-11-07: qty 1

## 2021-11-07 MED ORDER — IBUPROFEN 600 MG PO TABS
600.0000 mg | ORAL_TABLET | Freq: Four times a day (QID) | ORAL | Status: DC
  Administered 2021-11-07 – 2021-11-08 (×4): 600 mg via ORAL
  Filled 2021-11-07 (×3): qty 1
  Filled 2021-11-07: qty 2

## 2021-11-07 MED ORDER — SODIUM CHLORIDE 0.9% IV SOLUTION
Freq: Once | INTRAVENOUS | Status: AC
Start: 2021-11-07 — End: 2021-11-07

## 2021-11-07 NOTE — Progress Notes (Signed)
   11/07/21 0521  Assess: MEWS Score  Temp 98 F (36.7 C)  BP (!) 179/90  MAP (mmHg) 115  Pulse Rate (!) 111  Resp 14  Level of Consciousness Alert  SpO2 100 %  O2 Device Room Air  Assess: MEWS Score  MEWS Temp 0  MEWS Systolic 0  MEWS Pulse 2  MEWS RR 0  MEWS LOC 0  MEWS Score 2  MEWS Score Color Yellow  Assess: if the MEWS score is Yellow or Red  Were vital signs taken at a resting state? Yes  Focused Assessment No change from prior assessment  Does the patient meet 2 or more of the SIRS criteria? No  MEWS guidelines implemented *See Row Information* Yes  Notify: Charge Nurse/RN  Name of Charge Nurse/RN Notified Adriana RN  Date Charge Nurse/RN Notified 11/07/21  Time Charge Nurse/RN Notified 0530  Notify: Provider  Provider Name/Title Dr. Cletis Media  Date Provider Notified 11/07/21  Time Provider Notified [phone]  Method of Notification Call  Notification Reason Change in status;Critical result (hgb 5.7; yellow mews HR 111)  Provider response Other (Comment) (pending orders)  Date of Provider Response 11/07/21  Time of Provider Response 0545  Assess: SIRS CRITERIA  SIRS Temperature  0  SIRS Pulse 1  SIRS Respirations  0  SIRS WBC 1  SIRS Score Sum  2

## 2021-11-07 NOTE — Progress Notes (Signed)
Date and time results received: 11/07/21 0449   Test: Hemoglobin  Critical Value: 5.7  Name of Provider Notified: Dr.Rivard Katharine Look  Orders Received? Or Actions Taken?:  Pending orders from Dr. Cletis Media    Patient currently in no signs of distress, no pain but just bladder discomfort with pink tinged urine this morning. Will continue to monitor within remainder of shift.

## 2021-11-07 NOTE — Progress Notes (Addendum)
Lindsey Harrington is a65 y.o.  [id number]  Post Op Date # 1: Attempted Robot Assisted Hysterectomy/Abdominal Hysterectomy/Bilateral Salpingectomy/Cystoscopy  Subjective: Patient is Doing well postoperatively. Patient has Pain is controlled with current analgesics. Medications being used: prescription NSAID's including ketorolac (Toradol) and narcotic analgesics including Hydromorphone ., Lightheaded only with sitting straight up-has not ambulated yet. Diet:Tolerating liquids. Patient has Foley in place Activity: Has not ambulated yet but is sitting up in bed without any dizziness or  lightheadedness.  Objective: Vital signs in last 24 hours: Temp:  [97.4 F (36.3 C)-99.1 F (37.3 C)] 98.7 F (37.1 C) (08/04 0656) Pulse Rate:  [89-111] 110 (08/04 0656) Resp:  [11-20] 20 (08/04 0656) BP: (120-179)/(69-90) 120/70 (08/04 0656) SpO2:  [96 %-100 %] 100 % (08/04 0656)  Intake/Output from previous day: 08/03 0701 - 08/04 0700 In: 4344.5 [P.O.:240; I.V.:3604.5] Out: 2500 [Urine:1950] Intake/Output this shift: No intake/output data recorded. Recent Labs  Lab 11/04/21 1308 11/06/21 0709 11/07/21 0348  WBC 11.4*  --  19.6*  HGB 8.0* 9.5* 5.7*  HCT 27.9* 28.0* 20.0*  PLT 400  --  308     Recent Labs  Lab 11/06/21 0709  NA 140  K 3.6  CL 104  BUN <3*  CREATININE 0.70  GLUCOSE 164*    EXAM: General: alert, cooperative, and no distress Resp: clear to auscultation bilaterally Cardio: tachycardic (rate 102 bpm-regular) but no murmur, rub or gallop GI: bowel sounds present, incisions intact and wound dressing is clean/dry/intact Extremities: extremities normal, atraumatic, no cyanosis or edema, Homans sign is negative, no sign of DVT, and no calf tenderness   Assessment: s/p Procedure(s): ATTEMPTED XI ROBOTIC ASSISTED LAPAROSCOPIC HYSTERECTOMY/ABDOMINAL HYSTERECTOMY: stable, progressing well, anemia, and receiving blood transfusion  Plan: Advance diet Encourage  ambulation Advance to PO medication Routine Care to include an abdominal K-pad  LOS: 1 day    Earnstine Regal, PA-C 11/07/2021 8:04 AM   Seen and agreed Feeling better, smiling. Pain is well managed. Has not ambulated yet. Abdomen: soft, appropriately tender, dressing dry and clean. Lower extremities: normal  Plan:  Change pain management to po D/C Foley H&H after 2nd unit PRC is completed. Ambulate. Anticipating d/c home tomorrow

## 2021-11-07 NOTE — Progress Notes (Signed)
Transition of Care [facility]) Screening Note  Patient Details  Name: Lindsey Harrington Date of Birth: [birth year]  [facility]) CM/SW Contact:    Sherie Don, LCSW Phone Number: 11/07/2021, 9:54 AM  Transition of Care Department [facility]) has reviewed patient and no TOC needs have been identified at this time. We will continue to monitor patient advancement through interdisciplinary progression rounds. If new patient transition needs arise, please place a TOC consult.

## 2021-11-08 LAB — BPAM RBC
Blood Product Expiration Date: 202309042359
Blood Product Expiration Date: 202309052359
ISSUE DATE / TIME: 202308040632
ISSUE DATE / TIME: 202308041130
Unit Type and Rh: 5100
Unit Type and Rh: 5100

## 2021-11-08 LAB — TYPE AND SCREEN
ABO/RH(D): O POS
Antibody Screen: NEGATIVE
Unit division: 0
Unit division: 0

## 2021-11-08 MED ORDER — ACETAMINOPHEN 500 MG PO TABS: ORAL_TABLET | ORAL | 2 refills | Status: DC

## 2021-11-08 MED ORDER — TRAMADOL HCL 50 MG PO TABS: ORAL_TABLET | ORAL | 0 refills | Status: DC

## 2021-11-08 MED ORDER — IBUPROFEN 600 MG PO TABS: ORAL_TABLET | ORAL | 1 refills | Status: DC

## 2021-11-08 NOTE — Progress Notes (Signed)
Assessment unchanged. Pt verbalized understanding of dc instructions including medications and follow up care. Discharged via wc to front entrance by NT.

## 2021-11-08 NOTE — Plan of Care (Signed)

## 2021-11-08 NOTE — Progress Notes (Signed)
Lindsey Harrington is a30 y.o.  [id number]  Post Op Date # 2: Attempted Robot Assisted Laparoscopic Hysterectomy/Abdominal Hysterectomy/Bilateral Salpingectomy /Cystoscopy  Subjective: Patient is Doing well postoperatively. Patient has Pain is controlled with current analgesics. Medications being used: acetaminophen, prescription NSAID's including ibuprofen (Motrin), and narcotic analgesics including tramadol (Ultram). Diet:regular diet Patient has voided without difficulty and passed flatus Yes.   Activity: walking  Objective: Vital signs in last 24 hours: Temp:  [98.3 F (36.8 C)-98.9 F (37.2 C)] 98.6 F (37 C) (08/05 0601) Pulse Rate:  [94-109] 94 (08/05 0601) Resp:  [16-18] 18 (08/05 0601) BP: (125-155)/(71-97) 153/97 (08/05 0601) SpO2:  [99 %-100 %] 99 % (08/05 0601)  Intake/Output from previous day: 08/04 0701 - 08/05 0700 In: 2602.4 [P.O.:600; I.V.:1353.1] Out: 1500 [Urine:1500] Intake/Output this shift: No intake/output data recorded. Recent Labs  Lab 11/04/21 1308 11/06/21 0709 11/07/21 0348 11/07/21 1846  WBC 11.4*  --  19.6*  --   HGB 8.0* 9.5* 5.7* 7.6*  HCT 27.9* 28.0* 20.0* 26.1*  PLT 400  --  308  --      Recent Labs  Lab 11/06/21 0709  NA 140  K 3.6  CL 104  BUN <3*  CREATININE 0.70  GLUCOSE 164*    EXAM: General: alert, cooperative, and no distress Resp: clear to auscultation bilaterally Cardio: regular rate and rhythm, S1, S2 normal, no murmur, click, rub or gallop GI: bowel sounds present in all 4 quadrants, non-tender, soft; incisions intact with no evidence of infection; lower abdominal wound dressing is clean/dry/intact Extremities: Homans sign is negative, no sign of DVT and no calf tenderness   Assessment: s/p Procedure(s): ATTEMPTED XI ROBOTIC ASSISTED LAPAROSCOPIC HYSTERECTOMY/ABDOMINAL HYSTERECTOMY: stable, progressing well, tolerating diet, and anemia  Plan: Discharge home  LOS: 2 days    Earnstine Regal, PA-C 11/08/2021 10:15  AM

## 2021-11-08 NOTE — Discharge Summary (Signed)
Physician Discharge Summary  Patient ID: Lindsey Harrington MRN: [id number] DOB/AGE: Jun 16, 1975 46 y.o.  Admit date: 11/06/2021 Discharge date: 11/08/2021   Discharge Diagnoses:  Principal Problem:   Menorrhagia Fibroid Uterus Anemia  Operation:  Attempted Robot Assisted Laparoscopic Hysterectomy/Abdominal Hysterectomy/Bilateral Salpingectomy /Cystoscopy    Discharged Condition: good  Hospital Course: On the date of admission the patient underwent the aforementioned procedures and tolerated them well.  Post operative course was marked by the transfusion of 2 units of packed red blood cells for a  post operative hemoglobin of 5.7 (pre-op hemoglobin was 8.0).  The patient's post transfusion hemoglobin was 7.6 and she was not orthostatic with this value.  By post operative day #2 the patient had resumed bowel and bladder function and was therefore deemed ready for discharge home.  Disposition: Discharge disposition: 01-Home or Self Care       Discharge Medications:  Allergies as of 11/08/2021       Reactions   Oxycodone Other (See Comments)   Severe shakiness, dizziness and heart racing.        Medication List     STOP taking these medications    fluconazole 150 MG tablet Commonly known as: DIFLUCAN   Myfembree 40-1-0.5 MG Tabs Generic drug: Relugolix-Estradiol-Norethind       TAKE these medications    acetaminophen 500 MG tablet Commonly known as: TYLENOL take 2 tablets po every 6 hours for 5 days for post operative pain then as needed What changed:  how much to take how to take this when to take this reasons to take this additional instructions   ibuprofen 600 MG tablet Commonly known as: ADVIL take 1 tablet po pc every 6 hours for 5 days then prn post operative pain What changed:  medication strength how much to take how to take this when to take this reasons to take this additional instructions   minocycline 100 MG capsule Commonly known as:  MINOCIN Take 100 mg by mouth daily.   multivitamin tablet Take 2 tablets by mouth daily.   PreviDent 1.1 % Gel dental gel Generic drug: sodium fluoride Place 1 Application onto teeth daily.   RABEprazole 20 MG tablet Commonly known as: Aciphex Take 1 tablet (20 mg total) by mouth daily.   traMADol 50 MG tablet Commonly known as: Ultram take 1 tablet po every 6 hours as needed for breakthrough post operative pain           Follow-up: Dr. Katharine Look A. Rivard on November 20, 2021 at 4 p.m. and December 18, 2021 at 2 p.m.     Signed: Earnstine Regal, PA-C 11/08/2021, 10:27 AM

## 2021-11-11 ENCOUNTER — Ambulatory Visit (HOSPITAL_COMMUNITY)
Admission: EM | Admit: 2021-11-11 | Discharge: 2021-11-11 | Disposition: A | Discharge status: Home / Self Care | Payer: BC Managed Care – PPO | Attending: Family Medicine | Admitting: Family Medicine

## 2021-11-11 ENCOUNTER — Encounter (HOSPITAL_COMMUNITY): Payer: Self-pay

## 2021-11-11 DIAGNOSIS — L720 Epidermal cyst: Secondary | ICD-10-CM

## 2021-11-11 DIAGNOSIS — L089 Local infection of the skin and subcutaneous tissue, unspecified: Secondary | ICD-10-CM | POA: Diagnosis not present

## 2021-11-11 NOTE — ED Triage Notes (Signed)
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 She was seen at [facility] yesterday and reports they did nothing to help.She has an appt with her dermatologist on 11/14/2021.

## 2021-11-11 NOTE — Discharge Instructions (Signed)
Continue taking minocycline and keep your dermatology appointment. If not allergic, you may use over the counter ibuprofen or acetaminophen as needed.

## 2021-11-11 NOTE — ED Provider Notes (Signed)
[facility]   [id number] 11/11/21 Arrival Time: 5035  ASSESSMENT & PLAN:  1. Infected epidermoid cyst     Incision and Drainage Procedure Note  Anesthesia: PainEaze spray.  Procedure Details  The procedure, risks and complications have been discussed in detail (including, but not limited to pain and bleeding) with the patient.  The skin induration was prepped and draped in the usual fashion. After adequate local anesthesia, I&D with a #11 blade was performed on the left posterior neck with purulent drainage.  EBL: minimal Drains: none Packing: n/a Condition: Tolerated procedure well Complications: none.    Discharge Instructions      Continue taking minocycline and keep your dermatology appointment. If not allergic, you may use over the counter ibuprofen or acetaminophen as needed.     Reviewed expectations re: course of current medical issues. Questions answered. Outlined signs and symptoms indicating need for more acute intervention. Patient verbalized understanding. After Visit Summary given.   SUBJECTIVE:  Lindsey Harrington is a 46 y.o. female who presents with a possible infection of her post L neck; h/o cyst removal here; followed by dermatology; has f/u in three days; "couldn't wait; is painful". Without active drainage and without active bleeding. Afebrile.   OBJECTIVE:  Vitals:   11/11/21 1532  BP: (!) 146/83  Pulse: (!) 101  Resp: 16  Temp: 98.1 F (36.7 C)  TempSrc: Oral  SpO2: 95%    General appearance: alert; no distress Neck: approx 1 cm induration over horizontal scar; very fluctuant; tender to touch; no active drainage or bleeding Psychological: alert and cooperative; normal mood and affect  Allergies  Allergen Reactions   Oxycodone Other (See Comments)    Severe shakiness, dizziness and heart racing.    Past Medical History:  Diagnosis Date   Abnormal Pap smear    2018 & 2019 - high grade squamous intraepithelial lesion    Anemia 01/17/2001   related to pregnancy / childbirth   COVID-19 05/2020   HA, sore throat, fever, cough (No prescription medications taken per pt.)   Dysuria 07/05/1997   postcoital   First trimester bleeding 04/07/2000   GBS (group B streptococcus) infection 2002   GERD (gastroesophageal reflux disease)    currently taking Aciphex as of 10/28/21   H/O candidiasis    H/O cystitis    H/O hemorrhoids 01/17/2001   H/O herpes simplex infection 2003   H/O low back pain 05/24/2002   H/O varicella    Hx: UTI (urinary tract infection) 07/05/1997   Irregular menses 06/03/2001   Monilial vaginitis 05/24/2002   Pelvic pain 03/14/2003   Trichomonas    Social History   Socioeconomic History   Marital status: Married    Spouse name: Not on file   Number of children: Not on file   Years of education: Not on file   Highest education level: Not on file  Occupational History   Not on file  Tobacco Use   Smoking status: Never   Smokeless tobacco: Never  Vaping Use   Vaping Use: Never used  Substance and Sexual Activity   Alcohol use: Yes    Comment: 1 or 2 drinks per month   Drug use: No   Sexual activity: Yes    Partners: Male    Birth control/protection: Pill    Comment: Myfembree  Other Topics Concern   Not on file  Social History Narrative   ** Merged History Encounter **       Social Determinants of Health  Financial Resource Strain: Not on file  Food Insecurity: Not on file  Transportation Needs: Not on file  Physical Activity: Not on file  Stress: Not on file  Social Connections: Not on file   Family History  Problem Relation Age of Onset   Heart disease Father    Hyperlipidemia Father    Hypertension Maternal Grandmother    Stroke Maternal Grandmother    Gout Maternal Grandmother    Arthritis Maternal Grandmother    Cancer Maternal Grandfather    Hypertension Brother    Cancer Paternal Aunt    Cancer Paternal Uncle    Muscular dystrophy Sister     Past Surgical History:  Procedure Laterality Date   CERVICAL CONIZATION W/BX N/A 03/19/2017   Procedure: CONIZATION CERVIX WITH BIOPSY;  Surgeon: Ena Dawley, MD;  Location: [facility];  Service: Gynecology;  Laterality: N/A;   CERVICAL CONIZATION W/BX N/A 04/01/2018   Procedure: CONIZATION CERVIX WITH BIOPSY;  Surgeon: Ena Dawley, MD;  Location: [facility];  Service: Gynecology;  Laterality: N/A;   CESAREAN SECTION  2002 & 2006 & 2011   CYST EXCISION  10/2020   epidermal cyst on neck   ROBOTIC ASSISTED LAPAROSCOPIC HYSTERECTOMY AND SALPINGECTOMY N/A 11/06/2021   Procedure: ATTEMPTED XI ROBOTIC ASSISTED LAPAROSCOPIC HYSTERECTOMY/ABDOMINAL HYSTERECTOMY;  Surgeon: Delsa Bern, MD;  Location: [facility];  Service: Gynecology;  Laterality: N/A;   WISDOM TOOTH EXTRACTION  04/06/1996            Vanessa Kick, MD 11/11/21 1558

## 2021-11-14 DIAGNOSIS — L732 Hidradenitis suppurativa: Secondary | ICD-10-CM | POA: Diagnosis not present

## 2021-11-14 DIAGNOSIS — Z792 Long term (current) use of antibiotics: Secondary | ICD-10-CM | POA: Diagnosis not present

## 2021-11-14 DIAGNOSIS — L089 Local infection of the skin and subcutaneous tissue, unspecified: Secondary | ICD-10-CM | POA: Diagnosis not present

## 2021-11-17 ENCOUNTER — Encounter (HOSPITAL_BASED_OUTPATIENT_CLINIC_OR_DEPARTMENT_OTHER): Payer: Self-pay | Admitting: Obstetrics and Gynecology

## 2021-11-17 NOTE — Addendum Note (Signed)
Addendum  created 11/17/21 1508 by Freddrick March, MD   Intraprocedure Event edited, Intraprocedure Staff edited

## 2021-11-27 DIAGNOSIS — L732 Hidradenitis suppurativa: Secondary | ICD-10-CM | POA: Diagnosis not present

## 2021-11-27 DIAGNOSIS — L089 Local infection of the skin and subcutaneous tissue, unspecified: Secondary | ICD-10-CM | POA: Diagnosis not present

## 2022-01-22 DIAGNOSIS — L83 Acanthosis nigricans: Secondary | ICD-10-CM | POA: Diagnosis not present

## 2022-01-22 DIAGNOSIS — L732 Hidradenitis suppurativa: Secondary | ICD-10-CM | POA: Diagnosis not present

## 2022-02-04 DIAGNOSIS — L732 Hidradenitis suppurativa: Secondary | ICD-10-CM | POA: Diagnosis not present

## 2022-02-19 DIAGNOSIS — E559 Vitamin D deficiency, unspecified: Secondary | ICD-10-CM | POA: Diagnosis not present

## 2022-02-19 DIAGNOSIS — Z139 Encounter for screening, unspecified: Secondary | ICD-10-CM | POA: Diagnosis not present

## 2022-02-19 DIAGNOSIS — R7303 Prediabetes: Secondary | ICD-10-CM | POA: Diagnosis not present

## 2022-03-04 DIAGNOSIS — E559 Vitamin D deficiency, unspecified: Secondary | ICD-10-CM | POA: Diagnosis not present

## 2022-03-04 DIAGNOSIS — R718 Other abnormality of red blood cells: Secondary | ICD-10-CM | POA: Diagnosis not present

## 2022-03-04 DIAGNOSIS — R7303 Prediabetes: Secondary | ICD-10-CM | POA: Diagnosis not present

## 2022-04-23 ENCOUNTER — Ambulatory Visit (INDEPENDENT_AMBULATORY_CARE_PROVIDER_SITE_OTHER): Payer: BC Managed Care – PPO | Admitting: Family Medicine

## 2022-04-23 ENCOUNTER — Encounter (INDEPENDENT_AMBULATORY_CARE_PROVIDER_SITE_OTHER): Payer: Self-pay | Admitting: Family Medicine

## 2022-04-23 VITALS — BP 137/81 | HR 82 | Temp 98.7°F | Ht 66.0 in | Wt 218.0 lb

## 2022-04-23 DIAGNOSIS — Z6835 Body mass index (BMI) 35.0-35.9, adult: Secondary | ICD-10-CM

## 2022-04-23 DIAGNOSIS — Z0289 Encounter for other administrative examinations: Secondary | ICD-10-CM

## 2022-04-23 DIAGNOSIS — R7303 Prediabetes: Secondary | ICD-10-CM | POA: Diagnosis not present

## 2022-04-23 DIAGNOSIS — E669 Obesity, unspecified: Secondary | ICD-10-CM

## 2022-04-23 DIAGNOSIS — D259 Leiomyoma of uterus, unspecified: Secondary | ICD-10-CM

## 2022-05-14 DIAGNOSIS — L732 Hidradenitis suppurativa: Secondary | ICD-10-CM | POA: Diagnosis not present

## 2022-05-14 NOTE — Progress Notes (Signed)
Office: [phone]  /  Fax: [phone]   Initial Visit  Lindsey Harrington was seen in clinic today to evaluate for obesity. She is interested in losing weight to improve overall health and reduce the risk of weight related complications. She presents today to review program treatment options, initial physical assessment, and evaluation.     She was referred by: PCP  Weight up and down after having kids (has 3-youngest is 12).  When asked how has your weight affected you? She states: Problems with depression and or anxiety.  Dress eating carbs, snacks when bored/stressed.  Some associated conditions: Prediabetes  Contributing factors: Other: Hysterectomy, travel with work.  Weight promoting medications identified: None  Current nutrition plan: None  [facility] level of [facility] [facility]  Current or previous pharmacotherapy: None  Response to medication: Never tried medications   Past medical history includes:   Past Medical History:  Diagnosis Date   Abnormal Pap smear    2018 & 2019 - high grade squamous intraepithelial lesion   Anemia 01/17/2001   related to pregnancy / childbirth   COVID-19 05/2020   HA, sore throat, fever, cough (No prescription medications taken per pt.)   Dysuria 07/05/1997   postcoital   First trimester bleeding 04/07/2000   GBS (group B streptococcus) infection 2002   GERD (gastroesophageal reflux disease)    currently taking Aciphex as of 10/28/21   H/O candidiasis    H/O cystitis    H/O hemorrhoids 01/17/2001   H/O herpes simplex infection 2003   H/O low back pain 05/24/2002   H/O varicella    Hx: UTI (urinary tract infection) 07/05/1997   Irregular menses 06/03/2001   Monilial vaginitis 05/24/2002   Pelvic pain 03/14/2003   Trichomonas      Objective:   BP 137/81   Pulse 82   Temp 98.7 F (37.1 C)   Ht 5' 6"$  (1.676 m)   Wt 218 lb (98.9 kg)   SpO2 100%   BMI 35.19 kg/m  She was  weighed on the bioimpedance scale: Body mass index is 35.19 kg/m.  Peak Weight:233 ,Visceral Fat Rating:11, Body Fat%:43.9, Weight trend over the last 12 months: Decreasing  General:  Alert, oriented and cooperative. Patient is in no acute distress.  Respiratory: Normal respiratory effort, no problems with respiration noted  Extremities: Normal range of motion.    Mental Status: Normal mood and affect. Normal behavior. Normal judgment and thought content.   Assessment and Plan:  1. Pre-diabetes Last A1c 5.9 on 11/07/2021. Never used metformin. Consumes excess starches, little sweets.  Plan: Begin active plan for weight reduction.  2. Uterine leiomyoma, unspecified location Status post hysterectomy without bilateral salpingo-oophorectomy in 2023 with Dr. Cletis Media. Has felt more fatigue and joint inflammation since procedure.  Had labs in November with OB/GYN.  Plan: Attain November labs. Plan to check CBC, iron levels next visit.  3. Obesity,current BMI 35.3 1.  Reviewed bioimpedance results. 2.  Reviewed program information. 3.  Follow-up in 2-3 weeks.   We reviewed weight, biometrics, associated medical conditions and contributing factors with patient. She would benefit from weight loss therapy via a modified calorie, low-carb, high-protein nutritional plan tailored to their REE (resting energy expenditure) which will be determined by indirect calorimetry.  We will also assess for cardiometabolic risk and nutritional derangements via fasting serologies at her next appointment.      Obesity Treatment / Action Plan:  Patient will work on  garnering support from family and friends to begin weight loss journey. Will work on eliminating or reducing the presence of highly palatable, calorie dense foods in the home. Will complete provided nutritional and psychosocial assessment questionnaire before the next appointment. Will be scheduled for indirect calorimetry to determine resting  energy expenditure in a fasting state.  This will allow [place] to create a reduced calorie, high-protein meal plan to promote loss of fat mass while preserving muscle mass. Will think about ideas on how to incorporate physical activity into their daily routine. Was counseled on nutritional approaches to weight loss and benefits of complex carbs and high quality protein as part of nutritional weight management. Was counseled on pharmacotherapy and role as an adjunct in weight management.   Obesity Education Performed Today:  She was weighed on the bioimpedance scale and results were discussed and documented in the synopsis.  We discussed obesity as a disease and the importance of a more detailed evaluation of all the factors contributing to the disease.  We discussed the importance of long term lifestyle changes which include nutrition, exercise and behavioral modifications as well as the importance of customizing this to her specific health and social needs.  We discussed the benefits of reaching a healthier weight to alleviate the symptoms of existing conditions and reduce the risks of the biomechanical, metabolic and psychological effects of obesity.  Lindsey Harrington appears to be in the action stage of change and states they are ready to start intensive lifestyle modifications and behavioral modifications.  30 minutes was spent today on this visit including the above counseling, pre-visit chart review, and post-visit documentation.  Reviewed by clinician on day of visit: allergies, medications, problem list, medical history, surgical history, family history, social history, and previous encounter notes.   I, Georgianne Fick, FNP, am acting as transcriptionist for Dr. Loyal Gambler.   I have reviewed the above documentation for accuracy and completeness, and I agree with the above. Dell Ponto, DO

## 2022-05-27 ENCOUNTER — Telehealth (INDEPENDENT_AMBULATORY_CARE_PROVIDER_SITE_OTHER): Payer: Self-pay | Admitting: Family Medicine

## 2022-05-27 NOTE — Telephone Encounter (Signed)
Patient called in requesting an estimate of what would be billed to her insurance for first visit.  I have came up with an estimate of $636.00.  That would not be including any lab work as that is processed through [facility].    I have left patient vm to call back.

## 2022-05-28 ENCOUNTER — Ambulatory Visit (INDEPENDENT_AMBULATORY_CARE_PROVIDER_SITE_OTHER): Payer: BC Managed Care – PPO | Admitting: Family Medicine

## 2022-05-28 ENCOUNTER — Encounter (INDEPENDENT_AMBULATORY_CARE_PROVIDER_SITE_OTHER): Payer: Self-pay | Admitting: Family Medicine

## 2022-05-28 VITALS — BP 138/82 | HR 74 | Temp 98.2°F | Ht 66.0 in | Wt 213.0 lb

## 2022-05-28 DIAGNOSIS — R5383 Other fatigue: Secondary | ICD-10-CM

## 2022-05-28 DIAGNOSIS — E559 Vitamin D deficiency, unspecified: Secondary | ICD-10-CM | POA: Insufficient documentation

## 2022-05-28 DIAGNOSIS — R0602 Shortness of breath: Secondary | ICD-10-CM | POA: Insufficient documentation

## 2022-05-28 DIAGNOSIS — Z6834 Body mass index (BMI) 34.0-34.9, adult: Secondary | ICD-10-CM

## 2022-05-28 DIAGNOSIS — E611 Iron deficiency: Secondary | ICD-10-CM | POA: Diagnosis not present

## 2022-05-28 DIAGNOSIS — Z1331 Encounter for screening for depression: Secondary | ICD-10-CM | POA: Insufficient documentation

## 2022-05-28 DIAGNOSIS — F3289 Other specified depressive episodes: Secondary | ICD-10-CM

## 2022-05-28 DIAGNOSIS — L732 Hidradenitis suppurativa: Secondary | ICD-10-CM | POA: Diagnosis not present

## 2022-05-28 DIAGNOSIS — R7303 Prediabetes: Secondary | ICD-10-CM

## 2022-05-28 DIAGNOSIS — F32A Depression, unspecified: Secondary | ICD-10-CM | POA: Insufficient documentation

## 2022-05-28 DIAGNOSIS — K219 Gastro-esophageal reflux disease without esophagitis: Secondary | ICD-10-CM | POA: Diagnosis not present

## 2022-05-29 LAB — COMPREHENSIVE METABOLIC PANEL
ALT: 68 IU/L — ABNORMAL HIGH (ref 0–32)
AST: 40 IU/L (ref 0–40)
Albumin/Globulin Ratio: 1.7 (ref 1.2–2.2)
Albumin: 4.5 g/dL (ref 3.9–4.9)
Alkaline Phosphatase: 60 IU/L (ref 44–121)
BUN/Creatinine Ratio: 14 (ref 9–23)
BUN: 10 mg/dL (ref 6–24)
Bilirubin Total: 0.8 mg/dL (ref 0.0–1.2)
CO2: 21 mmol/L (ref 20–29)
Calcium: 9.8 mg/dL (ref 8.7–10.2)
Chloride: 102 mmol/L (ref 96–106)
Creatinine, Ser: 0.69 mg/dL (ref 0.57–1.00)
Globulin, Total: 2.6 g/dL (ref 1.5–4.5)
Glucose: 94 mg/dL (ref 70–99)
Potassium: 4.6 mmol/L (ref 3.5–5.2)
Sodium: 139 mmol/L (ref 134–144)
Total Protein: 7.1 g/dL (ref 6.0–8.5)
eGFR: 107 mL/min/{1.73_m2} (ref >59)

## 2022-05-29 LAB — CBC WITH DIFFERENTIAL/PLATELET
Basophils Absolute: 0 10*3/uL (ref 0.0–0.2)
Basos: 1 %
EOS (ABSOLUTE): 0.2 10*3/uL (ref 0.0–0.4)
Eos: 2 %
Hematocrit: 42 % (ref 34.0–46.6)
Hemoglobin: 13.2 g/dL (ref 11.1–15.9)
Immature Grans (Abs): 0.1 10*3/uL (ref 0.0–0.1)
Immature Granulocytes: 1 %
Lymphocytes Absolute: 2.6 10*3/uL (ref 0.7–3.1)
Lymphs: 34 %
MCH: 26.3 pg — ABNORMAL LOW (ref 26.6–33.0)
MCHC: 31.4 g/dL — ABNORMAL LOW (ref 31.5–35.7)
MCV: 84 fL (ref 79–97)
Monocytes Absolute: 0.5 10*3/uL (ref 0.1–0.9)
Monocytes: 6 %
Neutrophils Absolute: 4.3 10*3/uL (ref 1.4–7.0)
Neutrophils: 56 %
Platelets: 254 10*3/uL (ref 150–450)
RBC: 5.02 x10E6/uL (ref 3.77–5.28)
RDW: 16.5 % — ABNORMAL HIGH (ref 11.7–15.4)
WBC: 7.6 10*3/uL (ref 3.4–10.8)

## 2022-05-29 LAB — HEMOGLOBIN A1C
Est. average glucose Bld gHb Est-mCnc: 123 mg/dL
Hgb A1c MFr Bld: 5.9 % — ABNORMAL HIGH (ref 4.8–5.6)

## 2022-05-29 LAB — LIPID PANEL
Chol/HDL Ratio: 3.5 ratio (ref 0.0–4.4)
Cholesterol, Total: 137 mg/dL (ref 100–199)
HDL: 39 mg/dL — ABNORMAL LOW (ref >39)
LDL Chol Calc (NIH): 85 mg/dL (ref 0–99)
Triglycerides: 61 mg/dL (ref 0–149)
VLDL Cholesterol Cal: 13 mg/dL (ref 5–40)

## 2022-05-29 LAB — IRON AND TIBC
Iron Saturation: 10 % — ABNORMAL LOW (ref 15–55)
Iron: 41 ug/dL (ref 27–159)
Total Iron Binding Capacity: 396 ug/dL (ref 250–450)
UIBC: 355 ug/dL (ref 131–425)

## 2022-05-29 LAB — VITAMIN D 25 HYDROXY (VIT D DEFICIENCY, FRACTURES): Vit D, 25-Hydroxy: 44 ng/mL (ref 30.0–100.0)

## 2022-05-29 LAB — T4, FREE: Free T4: 1.21 ng/dL (ref 0.82–1.77)

## 2022-05-29 LAB — FOLATE: Folate: 16.4 ng/mL (ref >3.0)

## 2022-05-29 LAB — FERRITIN: Ferritin: 88 ng/mL (ref 15–150)

## 2022-05-29 LAB — INSULIN, RANDOM: INSULIN: 21.9 u[IU]/mL (ref 2.6–24.9)

## 2022-05-29 LAB — TSH: TSH: 0.364 u[IU]/mL — ABNORMAL LOW (ref 0.450–4.500)

## 2022-05-29 LAB — VITAMIN B12: Vitamin B-12: 675 pg/mL (ref 232–1245)

## 2022-06-10 NOTE — Progress Notes (Signed)
Chief Complaint:   OBESITY Lindsey Harrington (MR# [id number]) is a 47 y.o. female who presents for evaluation and treatment of obesity and related comorbidities. Current BMI is Body mass index is 34.38 kg/m. Lindsey Harrington has been struggling with her weight for many years and has been unsuccessful in either losing weight, maintaining weight loss, or reaching her healthy weight goal.  Lindsey Harrington works a sedentary job in [employer].  She lives with her husband and 3 children.  She averages about 10,000 steps per day.  She craves starches, chips and sweets.  She eats out frequently and is a stress eater.  She does not like to cook.  Her husband is helping out with meals.  Fitting in workouts for now.  Lindsey Harrington is currently in the action stage of change and ready to dedicate time achieving and maintaining a healthier weight. Lindsey Harrington is interested in becoming our patient and working on intensive lifestyle modifications including (but not limited to) diet and exercise for weight loss.  Lindsey Harrington's habits were reviewed today and are as follows: Her family eats meals together, she thinks her family will eat healthier with her, she struggles with family and or coworkers weight loss sabotage, her desired weight loss is 38 lbs, she started gaining weight after the birth of her first child, her heaviest weight ever was 234 pounds, she has significant food cravings issues, she snacks frequently in the evenings, she is frequently drinking liquids with calories, she frequently makes poor food choices, she frequently eats larger portions than normal, and she struggles with emotional eating.  Depression Screen [name]'s Food and Mood (modified PHQ-9) score was 9.  Subjective:   1. Other fatigue Tais admits to daytime somnolence and admits to waking up still tired. Patient has a history of symptoms of daytime fatigue and morning fatigue. Shivali generally gets 7 hours of sleep per night, and states that she has nightime  awakenings and generally restful sleep. Snoring is present. Apneic episodes are not present. Epworth Sleepiness Score is 2.  EKG, NSR without ischemic changes.  2. SOBOE (shortness of breath on exertion) Bellah notes increasing shortness of breath with exercising and seems to be worsening over time with weight gain. She notes getting out of breath sooner with activity than she used to. This has not gotten worse recently. Mark denies shortness of breath at rest or orthopnea.  3. Vitamin D deficiency Patient is taking prescription ergocalciferol weekly.  Patient complains of fatigue.  4. Gastroesophageal reflux disease, unspecified whether esophagitis present Patient is taking Aciphex 20 mg daily, controls symptoms well.  5. Iron deficiency Patient is taking iron 65 mg twice daily.  Complains of fatigue.  S/p TAH last year.  6. Hidradenitis suppurativa Patient is taking clindamycin 150 mg daily.  [facility] [place].  7. Pre-diabetes 11/07/2021 A1c was 5.9.  Patient has been over consuming carbs and sugar.  Patient is at risk for type 2 diabetes mellitus with family history of BMI greater than 30.  8. Other depression with emotional eating Bariatric PHQ-9:9.  Patient has a good support system.  Assessment/Plan:   1. Other fatigue Lindsey Harrington does feel that her weight is causing her energy to be lower than it should be. Fatigue may be related to obesity, depression or many other causes. Labs will be ordered, and in the meanwhile, Lindsey Harrington will focus on self care including making healthy food choices, increasing physical activity and focusing on stress reduction.  Update labs today.  - EKG 12-Lead - VITAMIN  D 25 Hydroxy (Vit-D Deficiency, Fractures) - TSH - T4, free - Lipid panel - Insulin, random - Hemoglobin A1c - Folate - Comprehensive metabolic panel - Vitamin 123456 - CBC with Differential/Platelet - Iron and TIBC - Ferritin  2. SOBOE (shortness of breath on  exertion) Lindsey Harrington does feel that she gets out of breath more easily that she used to when she exercises. Lindsey Harrington's shortness of breath appears to be obesity related and exercise induced. She has agreed to work on weight loss and gradually increase exercise to treat her exercise induced shortness of breath. Will continue to monitor closely.  3. Vitamin D deficiency Check vitamin D level today.  Goal of 50-70.  - VITAMIN D 25 Hydroxy (Vit-D Deficiency, Fractures)  4. Gastroesophageal reflux disease, unspecified whether esophagitis present Avoid late night eating and large portion sizes.  Begin active plan for weight loss.  5. Iron deficiency Check labs today.  - CBC with Differential/Platelet - Iron and TIBC - Ferritin  6. Hidradenitis suppurativa Look for improvements in HS with reduction of adiposity.  7. Pre-diabetes Check A1c and fasting insulin today.  Began prescribed meal plan.  - Insulin, random - Hemoglobin A1c  8. Other depression with emotional eating Work on stress reduction.  Consider adding Wellbutrin or CBT.  9. Depression screen Lindsey Harrington had a positive depression screening. Depression is commonly associated with obesity and often results in emotional eating behaviors. We will monitor this closely and work on CBT to help improve the non-hunger eating patterns. Referral to Psychology may be required if no improvement is seen as she continues in our clinic.  10. Morbid obesity ([place])  11. Obesity,current BMI 34.5 Eating out guide given.  Lindsey Harrington is currently in the action stage of change and her goal is to continue with weight loss efforts. I recommend Lindsey Harrington begin the structured treatment plan as follows:  She has agreed to the Category 3 Plan and keeping a food journal and adhering to recommended goals of 600 calories and 35+ protein at supper daily.  Exercise goals:  As is.     Behavioral modification strategies: increasing lean protein intake.  She was  informed of the importance of frequent follow-up visits to maximize her success with intensive lifestyle modifications for her multiple health conditions. She was informed we would discuss her lab results at her next visit unless there is a critical issue that needs to be addressed sooner. Amberlea agreed to keep her next visit at the agreed upon time to discuss these results.  Objective:   Blood pressure 138/82, pulse 74, temperature 98.2 F (36.8 C), height '5\' 6"'$  (1.676 m), weight 213 lb (96.6 kg), SpO2 100 %. Body mass index is 34.38 kg/m.  EKG: Normal sinus rhythm, rate 79 bpm.  Indirect Calorimeter completed today shows a VO2 of 234 and a REE of 1613.  Her calculated basal metabolic rate is [id number] thus her basal metabolic rate is worse than expected.  General: Cooperative, alert, well developed, in no acute distress. HEENT: Conjunctivae and lids unremarkable. Cardiovascular: Regular rhythm.  Lungs: Normal work of breathing. Neurologic: No focal deficits.   Lab Results  Component Value Date   CREATININE 0.69 05/28/2022   BUN 10 05/28/2022   NA 139 05/28/2022   K 4.6 05/28/2022   CL 102 05/28/2022   CO2 21 05/28/2022   Lab Results  Component Value Date   ALT 68 (H) 05/28/2022   AST 40 05/28/2022   ALKPHOS 60 05/28/2022   BILITOT 0.8 05/28/2022  Lab Results  Component Value Date   HGBA1C 5.9 (H) 05/28/2022   HGBA1C 5.9 (H) 11/07/2021   Lab Results  Component Value Date   INSULIN 21.9 05/28/2022   Lab Results  Component Value Date   TSH 0.364 (L) 05/28/2022   Lab Results  Component Value Date   CHOL 137 05/28/2022   HDL 39 (L) 05/28/2022   LDLCALC 85 05/28/2022   TRIG 61 05/28/2022   CHOLHDL 3.5 05/28/2022   Lab Results  Component Value Date   WBC 7.6 05/28/2022   HGB 13.2 05/28/2022   HCT 42.0 05/28/2022   MCV 84 05/28/2022   PLT 254 05/28/2022   Lab Results  Component Value Date   IRON 41 05/28/2022   TIBC 396 05/28/2022   FERRITIN 88 05/28/2022    Attestation Statements:   Reviewed by clinician on day of visit: allergies, medications, problem list, medical history, surgical history, family history, social history, and previous encounter notes.  I have personally spent 40 minutes total time today in preparation, patient care, nutritional counseling and documentation for this visit, including the following: review of clinical lab tests; review of medical tests/procedures/services.   I, Davy Pique, am acting as Location manager for Loyal Gambler, DO.  I have reviewed the above documentation for accuracy and completeness, and I agree with the above. Dell Ponto, DO

## 2022-06-11 ENCOUNTER — Ambulatory Visit (INDEPENDENT_AMBULATORY_CARE_PROVIDER_SITE_OTHER): Payer: BC Managed Care – PPO | Admitting: Family Medicine

## 2022-06-24 DIAGNOSIS — Z01818 Encounter for other preprocedural examination: Secondary | ICD-10-CM | POA: Diagnosis not present

## 2022-07-06 DIAGNOSIS — L732 Hidradenitis suppurativa: Secondary | ICD-10-CM | POA: Diagnosis not present

## 2022-07-15 DIAGNOSIS — L732 Hidradenitis suppurativa: Secondary | ICD-10-CM | POA: Diagnosis not present

## 2022-08-05 DIAGNOSIS — Z09 Encounter for follow-up examination after completed treatment for conditions other than malignant neoplasm: Secondary | ICD-10-CM | POA: Diagnosis not present

## 2022-08-26 DIAGNOSIS — K219 Gastro-esophageal reflux disease without esophagitis: Secondary | ICD-10-CM | POA: Diagnosis not present

## 2022-08-26 DIAGNOSIS — R748 Abnormal levels of other serum enzymes: Secondary | ICD-10-CM | POA: Diagnosis not present

## 2022-08-26 DIAGNOSIS — E6609 Other obesity due to excess calories: Secondary | ICD-10-CM | POA: Diagnosis not present

## 2022-09-05 IMAGING — MG MM DIGITAL DIAGNOSTIC UNILAT*L* W/ TOMO W/ CAD
8 of 14 series · 8 of 30 positions shown · non-contrast
Comparison: Previous exam(s).

CLINICAL DATA: 45-year-old female presenting for short-term
follow-up of probably benign left breast calcifications. The
calcifications were first evaluated in April 2020.

EXAM:
DIGITAL DIAGNOSTIC UNILATERAL LEFT MAMMOGRAM WITH TOMOSYNTHESIS AND
CAD
TECHNIQUE: Left digital diagnostic mammography and breast tomosynthesis was
performed. The images were evaluated with computer-aided detection.

[L ML (1 of 3)]
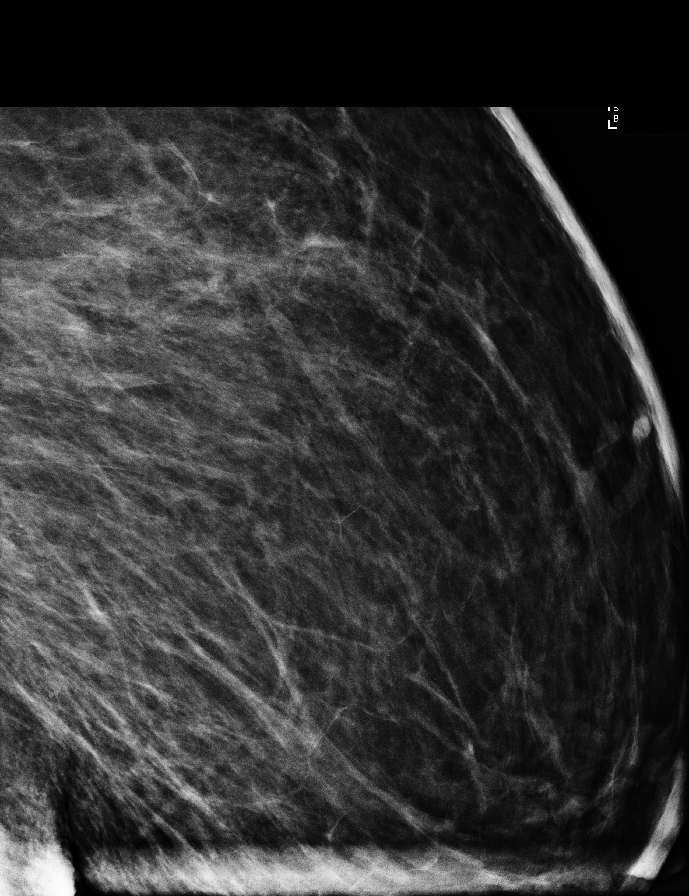

[L ML (2 of 3)]
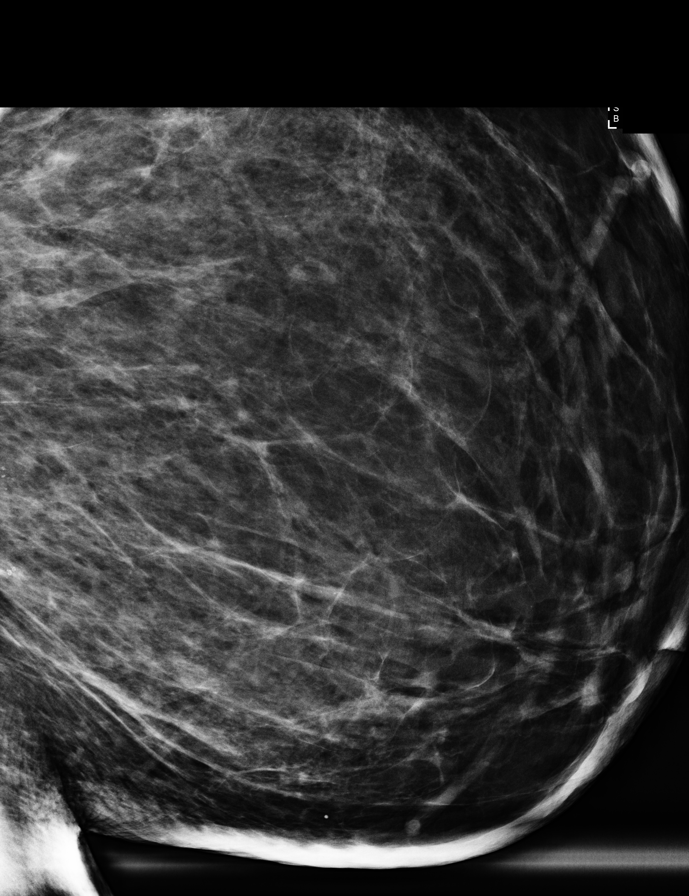

[L XCCL]
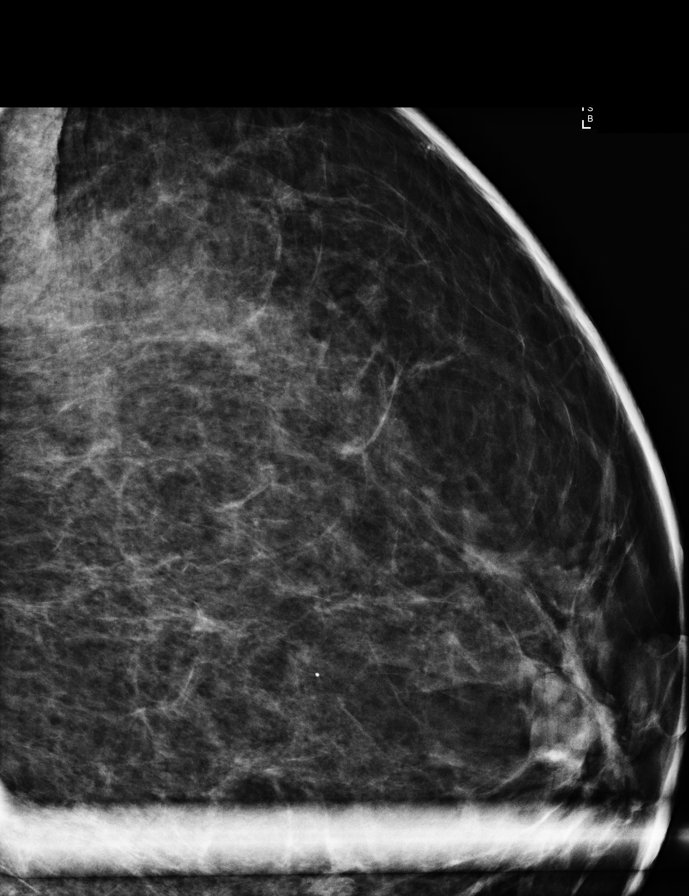

[L CC (1 of 2)]
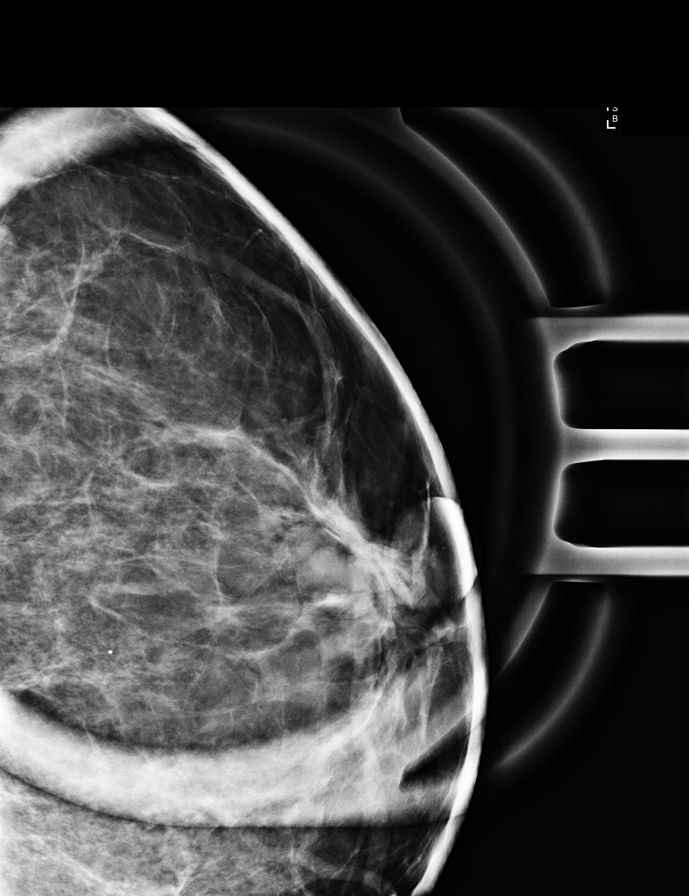

[L CC (2 of 2)]
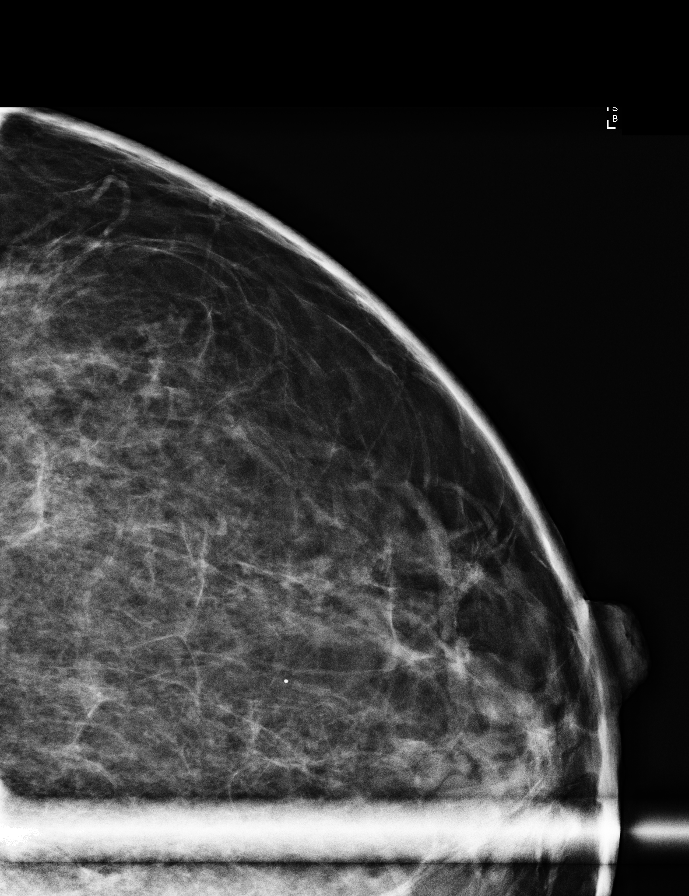

[L ML (3 of 3)]
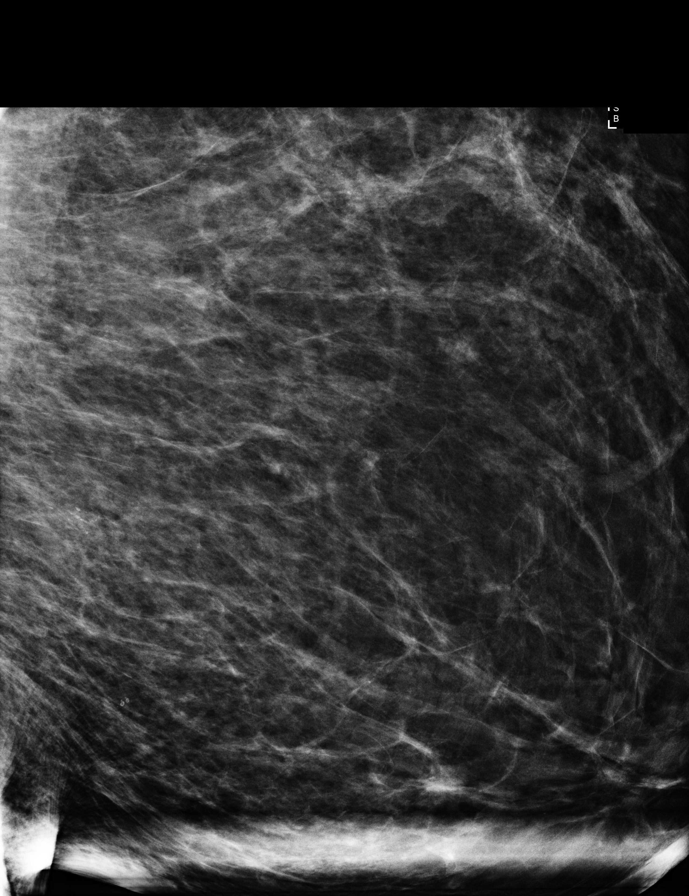

[L XCCL synth-2D]
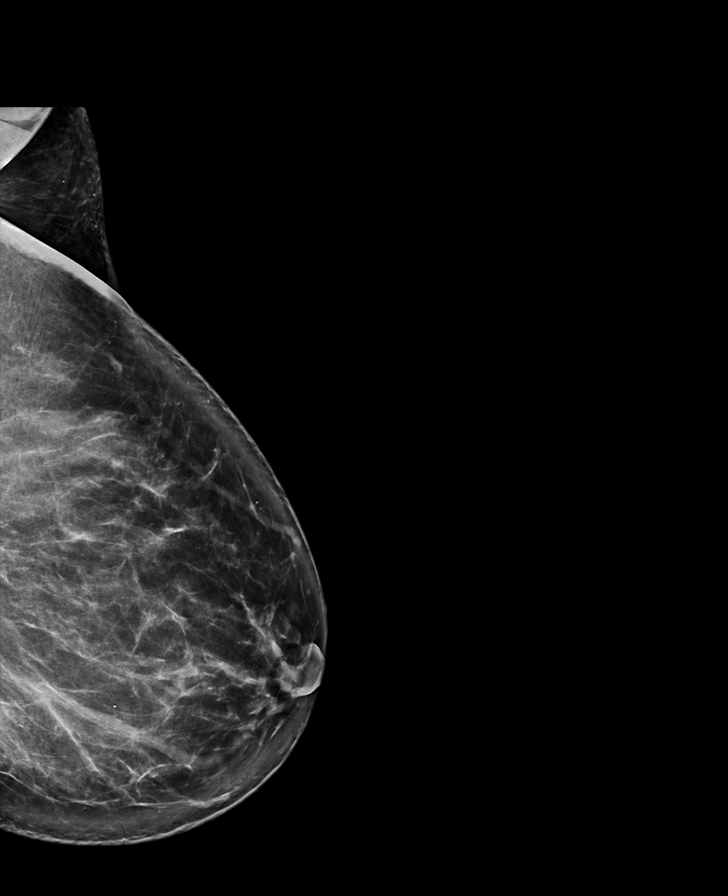

[L CC synth-2D]
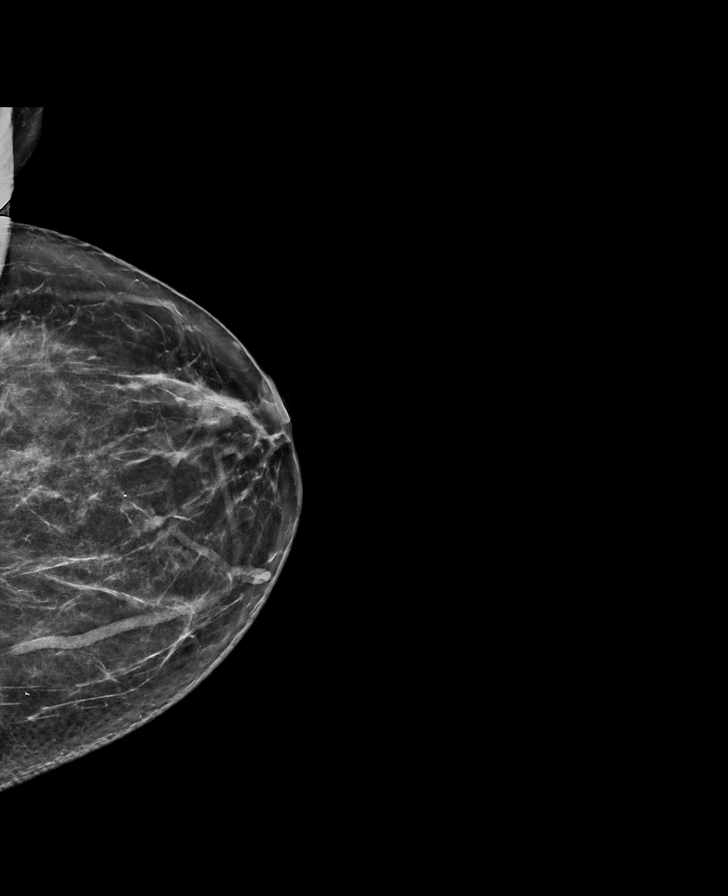

[8 of 30 positions shown; findings below may reference images not displayed]

ACR Breast Density Category b: There are scattered areas of
fibroglandular density.
FINDINGS: Spot 2D magnification views of the left breast were performed in
addition to standard views. The small group of calcifications in the
outer posterior left breast are unchanged and likely layer on
lateral view as seen with benign milk of calcium. There are no new
suspicious linear or branching forms. No new suspicious findings
elsewhere in the left breast.
IMPRESSION: Stable probably benign left breast calcifications.

RECOMMENDATION:
Diagnostic bilateral mammogram in October 2021 as patient will be due
for screening evaluation of the right breast at time.

I have discussed the findings and recommendations with the patient.
If applicable, a reminder letter will be sent to the patient
regarding the next appointment.

BI-RADS CATEGORY  3: Probably benign.

## 2022-10-27 ENCOUNTER — Other Ambulatory Visit: Payer: Self-pay | Admitting: Obstetrics and Gynecology

## 2022-10-27 DIAGNOSIS — Z1231 Encounter for screening mammogram for malignant neoplasm of breast: Secondary | ICD-10-CM

## 2022-11-27 ENCOUNTER — Ambulatory Visit
Admission: RE | Admit: 2022-11-27 | Discharge: 2022-11-27 | Disposition: A | Discharge status: Home / Self Care | Payer: BC Managed Care – PPO | Source: Ambulatory Visit | Attending: Obstetrics and Gynecology | Admitting: Obstetrics and Gynecology

## 2022-11-27 DIAGNOSIS — Z1231 Encounter for screening mammogram for malignant neoplasm of breast: Secondary | ICD-10-CM | POA: Diagnosis not present

## 2022-11-30 DIAGNOSIS — L821 Other seborrheic keratosis: Secondary | ICD-10-CM | POA: Diagnosis not present

## 2022-11-30 DIAGNOSIS — L918 Other hypertrophic disorders of the skin: Secondary | ICD-10-CM | POA: Diagnosis not present

## 2022-12-04 DIAGNOSIS — E669 Obesity, unspecified: Secondary | ICD-10-CM | POA: Diagnosis not present

## 2023-04-05 DIAGNOSIS — Z1330 Encounter for screening examination for mental health and behavioral disorders, unspecified: Secondary | ICD-10-CM | POA: Diagnosis not present

## 2023-04-05 DIAGNOSIS — Z01419 Encounter for gynecological examination (general) (routine) without abnormal findings: Secondary | ICD-10-CM | POA: Diagnosis not present

## 2023-04-06 DIAGNOSIS — L732 Hidradenitis suppurativa: Secondary | ICD-10-CM | POA: Diagnosis not present

## 2023-04-21 ENCOUNTER — Other Ambulatory Visit: Payer: Self-pay | Admitting: Medical Genetics

## 2023-04-22 DIAGNOSIS — L83 Acanthosis nigricans: Secondary | ICD-10-CM | POA: Diagnosis not present

## 2023-04-22 DIAGNOSIS — L732 Hidradenitis suppurativa: Secondary | ICD-10-CM | POA: Diagnosis not present

## 2023-05-20 ENCOUNTER — Other Ambulatory Visit (HOSPITAL_COMMUNITY): Payer: Self-pay

## 2023-05-27 ENCOUNTER — Other Ambulatory Visit (HOSPITAL_COMMUNITY): Payer: Self-pay

## 2023-06-10 ENCOUNTER — Other Ambulatory Visit (HOSPITAL_COMMUNITY): Payer: Self-pay

## 2023-06-17 ENCOUNTER — Other Ambulatory Visit

## 2023-06-25 ENCOUNTER — Other Ambulatory Visit

## 2023-06-25 DIAGNOSIS — Z006 Encounter for examination for normal comparison and control in clinical research program: Secondary | ICD-10-CM

## 2023-07-06 LAB — GENECONNECT MOLECULAR SCREEN: Genetic Analysis Overall Interpretation: NEGATIVE

## 2023-08-13 DIAGNOSIS — M25511 Pain in right shoulder: Secondary | ICD-10-CM | POA: Diagnosis not present

## 2023-08-13 DIAGNOSIS — M542 Cervicalgia: Secondary | ICD-10-CM | POA: Diagnosis not present

## 2023-08-13 DIAGNOSIS — M25531 Pain in right wrist: Secondary | ICD-10-CM | POA: Diagnosis not present

## 2023-10-15 ENCOUNTER — Other Ambulatory Visit: Payer: Self-pay | Admitting: Obstetrics and Gynecology

## 2023-10-15 DIAGNOSIS — Z1231 Encounter for screening mammogram for malignant neoplasm of breast: Secondary | ICD-10-CM

## 2023-12-22 DIAGNOSIS — L732 Hidradenitis suppurativa: Secondary | ICD-10-CM | POA: Diagnosis not present

## 2023-12-22 DIAGNOSIS — L821 Other seborrheic keratosis: Secondary | ICD-10-CM | POA: Diagnosis not present

## 2023-12-22 DIAGNOSIS — Z13220 Encounter for screening for lipoid disorders: Secondary | ICD-10-CM | POA: Diagnosis not present

## 2023-12-22 DIAGNOSIS — L83 Acanthosis nigricans: Secondary | ICD-10-CM | POA: Diagnosis not present

## 2023-12-30 ENCOUNTER — Ambulatory Visit
Admission: RE | Admit: 2023-12-30 | Discharge: 2023-12-30 | Disposition: A | Discharge status: Home / Self Care | Source: Ambulatory Visit | Attending: Obstetrics and Gynecology | Admitting: Obstetrics and Gynecology

## 2023-12-30 DIAGNOSIS — Z1231 Encounter for screening mammogram for malignant neoplasm of breast: Secondary | ICD-10-CM

## 2024-01-21 DIAGNOSIS — M25562 Pain in left knee: Secondary | ICD-10-CM | POA: Diagnosis not present

## 2024-01-21 DIAGNOSIS — M7702 Medial epicondylitis, left elbow: Secondary | ICD-10-CM | POA: Diagnosis not present

## 2024-01-21 DIAGNOSIS — S46012A Strain of muscle(s) and tendon(s) of the rotator cuff of left shoulder, initial encounter: Secondary | ICD-10-CM | POA: Diagnosis not present
